# Patient Record
Sex: Female | Born: 1975 | Race: Black or African American | Hispanic: No | Smoking: Current every day smoker
Health system: Southern US, Community
[De-identification: ages and names within clinical notes are randomized; demographics above are authoritative.]

## PROBLEM LIST (undated history)

## (undated) DIAGNOSIS — J984 Other disorders of lung: Secondary | ICD-10-CM

## (undated) HISTORY — PX: LUNG SURGERY: SHX703

---

## 1999-03-24 ENCOUNTER — Inpatient Hospital Stay (HOSPITAL_COMMUNITY): Admission: AD | Admit: 1999-03-24 | Discharge: 1999-03-24 | Payer: Self-pay | Admitting: *Deleted

## 1999-06-16 ENCOUNTER — Inpatient Hospital Stay (HOSPITAL_COMMUNITY): Admission: AD | Admit: 1999-06-16 | Discharge: 1999-06-16 | Payer: Self-pay | Admitting: *Deleted

## 1999-09-16 ENCOUNTER — Inpatient Hospital Stay (HOSPITAL_COMMUNITY): Admission: AD | Admit: 1999-09-16 | Discharge: 1999-09-16 | Payer: Self-pay | Admitting: *Deleted

## 2000-06-29 ENCOUNTER — Emergency Department (HOSPITAL_COMMUNITY): Admission: EM | Admit: 2000-06-29 | Discharge: 2000-06-29 | Payer: Self-pay | Admitting: Emergency Medicine

## 2001-02-03 ENCOUNTER — Inpatient Hospital Stay (HOSPITAL_COMMUNITY): Admission: AD | Admit: 2001-02-03 | Discharge: 2001-02-03 | Payer: Self-pay | Admitting: *Deleted

## 2001-03-03 ENCOUNTER — Inpatient Hospital Stay (HOSPITAL_COMMUNITY): Admission: AD | Admit: 2001-03-03 | Discharge: 2001-03-03 | Payer: Self-pay | Admitting: *Deleted

## 2001-09-19 ENCOUNTER — Encounter (INDEPENDENT_AMBULATORY_CARE_PROVIDER_SITE_OTHER): Payer: Self-pay | Admitting: Specialist

## 2001-09-19 ENCOUNTER — Inpatient Hospital Stay (HOSPITAL_COMMUNITY): Admission: AD | Admit: 2001-09-19 | Discharge: 2001-09-21 | Payer: Self-pay | Admitting: Obstetrics

## 2008-06-30 ENCOUNTER — Emergency Department (HOSPITAL_COMMUNITY): Admission: EM | Admit: 2008-06-30 | Discharge: 2008-06-30 | Payer: Self-pay | Admitting: Emergency Medicine

## 2010-01-03 ENCOUNTER — Emergency Department (HOSPITAL_COMMUNITY): Admission: EM | Admit: 2010-01-03 | Discharge: 2010-01-03 | Payer: Self-pay | Admitting: Family Medicine

## 2010-01-05 ENCOUNTER — Inpatient Hospital Stay (HOSPITAL_COMMUNITY): Admission: EM | Admit: 2010-01-05 | Discharge: 2010-01-19 | Payer: Self-pay | Admitting: Emergency Medicine

## 2010-01-05 ENCOUNTER — Emergency Department (HOSPITAL_COMMUNITY): Admission: EM | Admit: 2010-01-05 | Discharge: 2010-01-05 | Payer: Self-pay | Admitting: Emergency Medicine

## 2010-01-05 ENCOUNTER — Ambulatory Visit: Payer: Self-pay | Admitting: Internal Medicine

## 2010-01-07 ENCOUNTER — Ambulatory Visit: Payer: Self-pay | Admitting: Thoracic Surgery

## 2010-01-08 ENCOUNTER — Encounter (INDEPENDENT_AMBULATORY_CARE_PROVIDER_SITE_OTHER): Payer: Self-pay | Admitting: Internal Medicine

## 2010-01-10 ENCOUNTER — Telehealth (INDEPENDENT_AMBULATORY_CARE_PROVIDER_SITE_OTHER): Payer: Self-pay | Admitting: *Deleted

## 2010-01-10 ENCOUNTER — Encounter: Payer: Self-pay | Admitting: Thoracic Surgery

## 2010-01-11 ENCOUNTER — Ambulatory Visit: Payer: Self-pay | Admitting: Infectious Diseases

## 2010-01-24 ENCOUNTER — Ambulatory Visit: Payer: Self-pay | Admitting: Physician Assistant

## 2010-01-24 DIAGNOSIS — J869 Pyothorax without fistula: Secondary | ICD-10-CM | POA: Insufficient documentation

## 2010-01-24 DIAGNOSIS — D649 Anemia, unspecified: Secondary | ICD-10-CM | POA: Insufficient documentation

## 2010-01-25 LAB — CONVERTED CEMR LAB
BUN: 8 mg/dL (ref 6–23)
Basophils Absolute: 0.2 10*3/uL — ABNORMAL HIGH (ref 0.0–0.1)
Benzodiazepines.: NEGATIVE
Calcium: 9.5 mg/dL (ref 8.4–10.5)
Creatinine, Ser: 0.65 mg/dL (ref 0.40–1.20)
Creatinine,U: 201.4 mg/dL
Eosinophils Relative: 2 % (ref 0–5)
Hemoglobin: 10.4 g/dL — ABNORMAL LOW (ref 12.0–15.0)
Lymphs Abs: 2.1 10*3/uL (ref 0.7–4.0)
Methadone: NEGATIVE
Monocytes Relative: 8 % (ref 3–12)
Platelets: 715 10*3/uL — ABNORMAL HIGH (ref 150–400)
Potassium: 4.5 meq/L (ref 3.5–5.3)
Propoxyphene: NEGATIVE
RBC: 3.7 M/uL — ABNORMAL LOW (ref 3.87–5.11)
WBC: 7.3 10*3/uL (ref 4.0–10.5)

## 2010-01-26 ENCOUNTER — Ambulatory Visit: Payer: Self-pay | Admitting: Thoracic Surgery

## 2010-01-26 ENCOUNTER — Encounter: Admission: RE | Admit: 2010-01-26 | Discharge: 2010-01-26 | Payer: Self-pay | Admitting: Thoracic Surgery

## 2010-02-01 ENCOUNTER — Ambulatory Visit: Payer: Self-pay | Admitting: Infectious Diseases

## 2010-02-01 LAB — CONVERTED CEMR LAB
AST: 20 units/L (ref 0–37)
Albumin: 4.2 g/dL (ref 3.5–5.2)
BUN: 10 mg/dL (ref 6–23)
CO2: 25 meq/L (ref 19–32)
Eosinophils Relative: 9 % — ABNORMAL HIGH (ref 0–5)
Hemoglobin: 11 g/dL — ABNORMAL LOW (ref 12.0–15.0)
MCHC: 31.3 g/dL (ref 30.0–36.0)
MCV: 89.6 fL (ref >=78.0)
Monocytes Absolute: 0.6 10*3/uL (ref 0.1–1.0)
Monocytes Relative: 7 % (ref 3–12)
Neutrophils Relative %: 52 % (ref 43–77)
RBC: 3.93 M/uL (ref 3.87–5.11)
Sed Rate: 58 mm/hr — ABNORMAL HIGH (ref 0–22)
Total Bilirubin: 0.3 mg/dL (ref 0.3–1.2)
Total Protein: 8.1 g/dL (ref 6.0–8.3)
WBC: 7.6 10*3/uL (ref 4.0–10.5)

## 2010-02-16 ENCOUNTER — Encounter: Admission: RE | Admit: 2010-02-16 | Discharge: 2010-02-16 | Payer: Self-pay | Admitting: Thoracic Surgery

## 2010-02-16 ENCOUNTER — Ambulatory Visit: Payer: Self-pay | Admitting: Thoracic Surgery

## 2010-03-03 ENCOUNTER — Ambulatory Visit: Payer: Self-pay | Admitting: Infectious Diseases

## 2010-03-03 DIAGNOSIS — B373 Candidiasis of vulva and vagina: Secondary | ICD-10-CM | POA: Insufficient documentation

## 2010-03-03 LAB — CONVERTED CEMR LAB
Basophils Absolute: 0.1 10*3/uL (ref 0.0–0.1)
Basophils Relative: 1 % (ref 0–1)
CRP: 0.2 mg/dL (ref <0.6)
Hemoglobin: 12.2 g/dL (ref 12.0–15.0)
Lymphocytes Relative: 37 % (ref 12–46)
Lymphs Abs: 2.4 10*3/uL (ref 0.7–4.0)
MCHC: 32.4 g/dL (ref 30.0–36.0)
MCV: 87.3 fL (ref 78.0–100.0)
Platelets: 384 10*3/uL (ref 150–400)
RDW: 13.9 % (ref 11.5–15.5)
Sed Rate: 7 mm/hr (ref 0–22)

## 2010-03-23 ENCOUNTER — Encounter: Payer: Self-pay | Admitting: Infectious Diseases

## 2010-03-23 ENCOUNTER — Encounter: Admission: RE | Admit: 2010-03-23 | Discharge: 2010-03-23 | Payer: Self-pay | Admitting: Thoracic Surgery

## 2010-03-23 ENCOUNTER — Ambulatory Visit: Payer: Self-pay | Admitting: Thoracic Surgery

## 2010-08-24 ENCOUNTER — Ambulatory Visit: Payer: Self-pay | Admitting: Physician Assistant

## 2010-08-24 DIAGNOSIS — J0110 Acute frontal sinusitis, unspecified: Secondary | ICD-10-CM | POA: Insufficient documentation

## 2010-08-24 DIAGNOSIS — R21 Rash and other nonspecific skin eruption: Secondary | ICD-10-CM | POA: Insufficient documentation

## 2010-08-25 ENCOUNTER — Ambulatory Visit: Payer: Self-pay | Admitting: Internal Medicine

## 2010-08-25 ENCOUNTER — Encounter: Payer: Self-pay | Admitting: Physician Assistant

## 2010-08-25 DIAGNOSIS — R197 Diarrhea, unspecified: Secondary | ICD-10-CM

## 2010-08-25 DIAGNOSIS — R05 Cough: Secondary | ICD-10-CM | POA: Insufficient documentation

## 2010-08-26 ENCOUNTER — Telehealth (INDEPENDENT_AMBULATORY_CARE_PROVIDER_SITE_OTHER): Payer: Self-pay | Admitting: *Deleted

## 2010-09-23 ENCOUNTER — Telehealth (INDEPENDENT_AMBULATORY_CARE_PROVIDER_SITE_OTHER): Payer: Self-pay | Admitting: Internal Medicine

## 2010-09-23 ENCOUNTER — Encounter: Payer: Self-pay | Admitting: Physician Assistant

## 2010-12-20 NOTE — Assessment & Plan Note (Signed)
Summary: XFU--PNEUMONIA//YC   Vital Signs:  Patient profile:   35 year old female Height:      62.5 inches Weight:      167 pounds BMI:     30.17 Temp:     98.6 degrees F Pulse rate:   98 / minute Pulse rhythm:   regular Resp:     20 per minute BP sitting:   128 / 75  (left arm) Cuff size:   regular  Vitals Entered By: Vesta Mixer CMA (January 24, 2010 9:41 AM) CC: One time hosp f/u.  D/c on 01/20/10 after about a month of being in the hosp for Pneumonia.  Is in pain still from the "lung surgery" Sounds like chest tubes were in. Pain Assessment Patient in pain? yes     Location: left side Intensity: 8  Does patient need assistance? Ambulation Normal   Primary Care Provider:  Tereso Newcomer, PA-C  CC:  One time hosp f/u.  D/c on 01/20/10 after about a month of being in the hosp for Pneumonia.  Is in pain still from the "lung surgery" Sounds like chest tubes were in.Marland Kitchen  History of Present Illness: New Patient. No prior health care. Just d/c from [facility] with LLL pneumonia with empyema.  She required left VATS with decortication.  She also had ARF with creat over 3 on admxn.  She came back to baseline with creat < 1 at d/c.  She was noted to have blood loss anemia.  She was placed on 4-6 weeks of Augmentin and scheduled to have f/u with ID (Dr. Sampson Goon) 3/15 and CVTS (Dr. Edwyna Shell) 3/9.   Still having a lot of pain around sites where her chest tubes were. No fevers, chills, cough.  No vomiting or diarrhea.  No dyspnea.  No chest pain.  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  Edwyna Shell)  Family History: CAD - PGF (MI; CVA) DM - MGM CA- ? type (grandmother) Family History Hypertension - Dad Kidney disease - Dad  Social History: Occupation: Pension scheme manager (Clinical biochemist; call center) Separated 2 kids (boy and girl) Former Smoker Alcohol use-yes Drug use-no (prior THC use) Occupation:  employed Smoking Status:  never Drug Use:  past (marijuana)  Review of Systems  The patient denies fever, syncope, dyspnea on exertion, prolonged cough, melena, and hematochezia.    Physical Exam  General:  alert, well-developed, and well-nourished.   Head:  normocephalic and atraumatic.   Ears:  R ear normal and L ear normal.   Mouth:  pt unable to tolerate exam; cannot visualize post pharynx Neck:  supple.   Chest Wall:  very tender to light touch over chest tube sites stitches still intact no erythema or discharge  Lungs:  normal breath sounds except decreased BS in left base, no crackles, and no wheezes.   Heart:  normal rate and regular rhythm.   Abdomen:  soft.   Extremities:  no edema Neurologic:  alert & oriented X3 and cranial nerves II-XII intact.   Psych:  normally interactive.     Impression & Recommendations:  Problem # 1:  Preventive  Health Care (ICD-V70.0)  had flu and pneumovax in hosp states Td done in last 10 years - not sure of date needs CPP .  . . schedule in next 2-3 mos  Orders: T-Basic Metabolic Panel [id number]) T-CBC w/Diff [id number]) T-Drug Screen-Urine, (single) [id number]) T-TSH [phone]) T-Syphilis Test (RPR) [phone])  Problem # 2:  ANEMIA (ICD-285.9) blood loss in hosp (hg 9.2 at d/c) repeat cbc now to ensure improving   Orders: T-CBC w/Diff ([id number])  Problem # 3:  EMPYEMA (ICD-510.9) s/p Left VATS still having sig amount of pain o/w seems to be improved f/u with Dr. Edwyna Shell and Dr. Sampson Goon  Complete Medication List: 1)  Augmentin 875-125 Mg Tabs (Amoxicillin-pot clavulanate) .Marland Kitchen.. 1 by  mouth two times a day 2)  Ibuprofen 800 Mg Tabs (Ibuprofen) .Marland Kitchen.. 1 by mouth three times a day as needed 3)  Percocet 5-325 Mg Tabs (Oxycodone-acetaminophen) .Marland Kitchen.. 1-2 by mouth q 3 hours as needed  Patient Instructions: 1)  Take 650 - 1000 mg of tylenol every 4-6 hours as needed for relief of pain or comfort of fever. Avoid taking more than 4000 mg in a 24 hour period( can cause liver damage in higher doses).  2)  You can take ibuprofen if the tylenol does not help. 3)  Then, you can take the Percocet if the ibuprofen does not help. 4)  Schedule CPP with Mickey Hebel in 2-3 months. 5)  Return sooner as needed. 6)  Follow up with Dr. Edwyna Shell and Dr. Sampson Goon as directed. 7)  Contact Dr. Edwyna Shell for refills on pain medicines.

## 2010-12-20 NOTE — Assessment & Plan Note (Signed)
Summary: sinusitis; rash   Vital Signs:  Patient profile:   35 year old female Height:      62.5 inches Weight:      181 pounds BMI:     32.70 Temp:     98.3 degrees F oral Pulse rate:   72 / minute Pulse rhythm:   regular Resp:     18 per minute BP sitting:   140 / 86  (left arm) Cuff size:   regular  Vitals Entered By: Arm[place] Shannon (August 24, 2010 12:19 PM) CC: flu like symptoms.... pt says she has fever, achy body, headaches, no appetite, nausea... very weak pt did take otc meds..  meds review.. pt says she dont take any meds... Is Patient Diabetic? No Pain Assessment Patient in pain? no       Does patient need assistance? Functional Status Self care Ambulation Normal   Primary Care Provider:  Tereso Newcomer, PA-C  CC:  flu like symptoms.... pt says she has fever, achy body, headaches, no appetite, and nausea... very weak pt did take otc meds..  meds review.. pt says she dont take any meds....  History of Present Illness: Feeling bad x 2 days.  Fever 101 degrees.  + Headache -points to bilat frontal sinuses.  Jaws hurt.  +Cough - small amount of phlegm this am (green).  No hemoptysis.  Blood noted in nasal discharge.  Notes significant nasal congestion.  No dyspnea.  Has h/o empyema.  Used to have pain on left chest but resolved when she went back to work in May.  She started noticing again when she got sick 2 days ago.  No otalgia.  + sore throat.  Notes some nausea and diarrhea.  No vomiting.  Feels hungry . . .no appetite.  No rashes.  Feels like she did when she started to get sick at the beginning of the year when she ended up with empyema and required VATS.   Also has rash on chest.  Began after wearing cheap jewelry.  No pruritus.  Problems Prior to Update: 1)  Skin Rash  (ICD-782.1) 2)  Acute Frontal Sinusitis  (ICD-461.1) 3)  Candidiasis of Vulva and Vagina  (ICD-112.1) 4)  Preventive Health Care  (ICD-V70.0) 5)  Anemia  (ICD-285.9) 6)  Empyema   (ICD-510.9)  Allergies (verified): No Known Drug Allergies  Past History:  Past Medical History: Last updated: 01/24/2010 Left Lower Lobe Pneumonia with Empyema 12/2009 req. VATS   a.  assoc ARF likely related to dehydration; corrected prior to d/c from [facility].  Past Surgical History: Last updated: 01/24/2010 s/p  Left VATS, minithoracotomy, and drainage of  empyema with decortication (01/11/2010 - Dr. Edwyna Shell)  Physical Exam  General:  alert, well-developed, and well-nourished.   Head:  normocephalic and atraumatic.   Eyes:  pupils equal, pupils round, and pupils reactive to light.   Ears:  R ear normal and L ear normal.   Nose:  no external deformity.   Mouth:  pharynx pink and moist and no exudates.   Neck:  no cervical lymphadenopathy.   Lungs:  normal breath sounds, no crackles, and no wheezes.   Heart:  normal rate and regular rhythm.   Neurologic:  alert & oriented X3 and cranial nerves II-XII intact.   Skin:  mod to large sized diffuse plaque on sternal area with central clearing Psych:  normally interactive.     Impression & Recommendations:  Problem # 1:  SKIN RASH (ICD-782.1)  contact dermatitis vs tinea  will rx with lotrisone  Her updated medication list for this problem includes:    Lotrisone 1-0.05 % Crea (Clotrimazole-betamethasone) .Marland Kitchen... Apply to area two times a day until clear; do not use more than 2 weeks  Problem # 2:  ACUTE FRONTAL SINUSITIS (ICD-461.1) given her past hx will go ahead and tx with antibxs f/u if no better  The following medications were removed from the medication list:    Augmentin 875-125 Mg Tabs (Amoxicillin-pot clavulanate) .Marland Kitchen... 1 by mouth two times a day Her updated medication list for this problem includes:    Azithromycin 250 Mg Tabs (Azithromycin) .Marland Kitchen... Take 2 tabs by mouth today, then, starting tomorrow, take one by mouth once daily for 4 days  Complete Medication List: 1)  Azithromycin 250 Mg Tabs (Azithromycin) .... Take  2 tabs by mouth today, then, starting tomorrow, take one by mouth once daily for 4 days 2)  Lotrisone 1-0.05 % Crea (Clotrimazole-betamethasone) .... Apply to area two times a day until clear; do not use more than 2 weeks  Patient Instructions: 1)  Apply cream to chest two times a day for no more than 2 weeks. 2)  Schedule follow up if rash no better or getting worse. 3)  Take antibiotics until all gone. 4)  Get plenty of rest.  Drink plenty of fluids.  Take tylenol as needed.  Use nasal saline as needed for nasal congestion. 5)  Return for follow up if no better or feeling worse. Prescriptions: LOTRISONE 1-0.05 % CREA (CLOTRIMAZOLE-BETAMETHASONE) apply to area two times a day until clear; do not use more than 2 weeks  #45 grams x 0   Entered and Authorized by:   Tereso Newcomer PA-C   Signed by:   Tereso Newcomer PA-C on 08/24/2010   Method used:   Print then Give to Patient   RxID:   [id number] AZITHROMYCIN 250 MG TABS (AZITHROMYCIN) Take 2 tabs by mouth today, then, starting tomorrow, take one by mouth once daily for 4 days  #6 x 0   Entered and Authorized by:   Tereso Newcomer PA-C   Signed by:   Tereso Newcomer PA-C on 08/24/2010   Method used:   Print then Give to Patient   RxID:   [id number]

## 2010-12-20 NOTE — Letter (Signed)
Summary: PT INFORMATION SHEET  PT INFORMATION SHEET   Imported By: Arta Bruce 03/23/2010 12:10:50  _____________________________________________________________________  External Attachment:    Type:   Image     Comment:   External Document

## 2010-12-20 NOTE — Letter (Signed)
Summary: [employer]//FAXED REQUESTED RECORDS  [employer]//FAXED REQUESTED RECORDS   Imported By: Arta Bruce 10/12/2010 11:00:54  _____________________________________________________________________  External Attachment:    Type:   Image     Comment:   External Document

## 2010-12-20 NOTE — Assessment & Plan Note (Signed)
Summary: hsfu need chart empyema/kam   Primary Provider:  Tereso Newcomer, PA-C  CC:  new patient follow up from lung infection.  History of Present Illness: 35 yo previously healthy female admitted with PNA 2/16 and found to have empyema. 2/16-3/2 with post pna empyema. VATS by Dr Edwyna Shell 2/22  (Left VATS, minithoracotomy, and drainage of empyema with decortication).  Course complicated by blood loss anemia and arf both resolved.  Treated initially with IV abx but all cxs negative (blood, sputum and empyema fluid).  Urine strep pna and legionella ag neg.  HIV neg, MRSA pcr negative D/c on march 3rd with augmentin - no side effects since then. Breathing improved.  No cough.  Still with chest pain at site of chest tubes and throughout.  No fevers but having some night sweats.    Taking percocet 2 every 3 hours Now taking vicodin.    Preventive Screening-Counseling & Management  Alcohol-Tobacco     Alcohol drinks/day: 1     Smoking Status: never  Caffeine-Diet-Exercise     Caffeine use/day: no   Updated Prior Medication List: AUGMENTIN 875-125 MG TABS (AMOXICILLIN-POT CLAVULANATE) 1 by mouth two times a day HYDROCODONE-ACETAMINOPHEN 7.5-500 MG TABS (HYDROCODONE-ACETAMINOPHEN) take one to two tablets every 4 to 6 hours as needed for pain LORTAB 7.5-500 MG TABS (HYDROCODONE-ACETAMINOPHEN) one to tow by mouth q 4 to 6 hours as needed for pain  Current Allergies (reviewed today): No known allergies  Past History:  Past Medical History: Last updated: 01/24/2010 Left Lower Lobe Pneumonia with Empyema 12/2009 req. VATS   a.  assoc ARF likely related to dehydration; corrected prior to d/c from [facility].  Past Surgical History: Last updated: 01/24/2010 s/p  Left VATS, minithoracotomy, and drainage of  empyema with decortication (01/11/2010 - Dr. Edwyna Shell)  Family History: Last updated: 01/24/2010 CAD - PGF (MI; CVA) DM - MGM CA- ? 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; call center) Separated 2 kids (boy and girl) Former Smoker Alcohol use-yes Drug use-no (prior THC use)  Risk Factors: Alcohol Use: 1 (02/01/2010) Caffeine Use: no (02/01/2010)  Risk Factors: Smoking Status: never (02/01/2010)  Review of Systems       11 systems reviewed and negative except per HPI   Vital Signs:  Patient profile:   35 year old female Height:      62.5 inches (158.75 cm) Weight:      167.2 pounds (76.00 kg) BMI:     30.20 Temp:     97.6 degrees F (36.44 degrees C) oral Pulse rate:   84 / minute BP sitting:   138 / 90  (left arm)  Vitals Entered By: Wendall Mola CMA Duncan Dull) (February 01, 2010 10:27 AM) CC: new patient follow up from lung infection Is Patient Diabetic? No Pain Assessment Patient in pain? yes     Location: left side and abdomen Intensity: 7 Type: stabbing Onset of pain  Constant Nutritional Status BMI of 25 - 29 = overweight Nutritional Status Detail appetite "better"  Have you ever been in a relationship where you felt threatened, hurt or afraid?No   Does patient need assistance? Functional Status Self care Ambulation Normal Comments no missed doses of meds per patient   Physical Exam  General:  alert and well-developed.   Head:  normocephalic and atraumatic.   Eyes:  vision grossly intact, pupils equal, and pupils round.   Ears:  L ear normal.  Mouth:  good dentition.   Neck:  supple.   Lungs:  decreased bs L base ct sites well healed Heart:  normal rate and regular rhythm.   Abdomen:  soft and non-tender.   Msk:  normal ROM and no joint tenderness.   Extremities:  no cce Neurologic:  alert & oriented X3 and cranial nerves II-XII intact.   Additional Exam:  March 9th cxr IMPRESSION:    1.  Left pleural effusions slightly smaller.  Left lung infiltrates   improved.   3.  No new findings.   Impression &  Recommendations:  Problem # 1:  EMPYEMA (ICD-510.9) Assessment Improved 35 yo previously healthy female admitted with PNA 2/16 and found to have empyema. 2/16-3/2 with post pna empyema. VATS by Dr Edwyna Shell 2/22  (Left VATS, minithoracotomy, and drainage of empyema with decortication).  Course complicated by blood loss anemia and arf both resolved.  Treated initially with IV abx but all cxs negative (blood, sputum and empyema fluid).  Urine strep pna and legionella ag neg.  HIV neg, MRSA pcr negative.  AFB and fungal cx from pleura fluid negative    Improving clinically but still with  pain.  Pleural effusions persist on cxr. done by Dr Edwyna Shell.   Cxs including afb and fungal  negative. On augmentin - tolerating it well  ESR and crp were sky high in hospital - currently 58 and 0.3 so markedly improved from 101 and 40.   Contiue augmentin until seen in 3 weeks.   i have given her a refill this one time on vicodin.   Orders: Est. Patient Level IV (16109) T-Comprehensive Metabolic Panel [phone]) T-CBC w/Diff [id number]) T-Sed Rate (Automated) [id number]) T-C-Reactive Protein [phone])  Problem # 2:  ANEMIA (ICD-285.9)  repeat hgb today is stable  Orders: Est. Patient Level IV (24401)  Hgb: 10.4 (01/24/2010)   Hct: 33.1 (01/24/2010)   Platelets: 715 (01/24/2010) RBC: 3.70 (01/24/2010)   RDW: 13.7 (01/24/2010)   WBC: 7.3 (01/24/2010) MCV: 89.5 (01/24/2010)   MCHC: 31.4 (01/24/2010) TSH: 1.153 (01/24/2010)  Medications Added to Medication List This Visit: 1)  Hydrocodone-acetaminophen 7.5-500 Mg Tabs (Hydrocodone-acetaminophen) .... Take one to two tablets every 4 to 6 hours as needed for pain 2)  Lortab 7.5-500 Mg Tabs (Hydrocodone-acetaminophen) .... One to tow by mouth q 4 to 6 hours as needed for pain  Patient Instructions: 1)  Follow up for 3 weeks. 2)  Continue on the augmentin but call if you have new rash or diarrhea. 3)  Keep a fever and symptom diary and bring to  next visit. Prescriptions: LORTAB 7.5-500 MG TABS (HYDROCODONE-ACETAMINOPHEN) one to tow by mouth q 4 to 6 hours as needed for pain  #90 x 0   Entered and Authorized by:   Clydie Braun MD   Signed by:   Clydie Braun MD on 02/01/2010   Method used:   Print then Give to Patient   RxID:   [id number]  Process Orders Check Orders Results:     [facility]: ABN not required for this insurance Tests Sent for requisitioning (February 03, 2010 4:22 PM):     02/01/2010: [facility] -- T-Comprehensive Metabolic Panel [[id number]] (signed)     02/01/2010: [facility] -- T-CBC w/Diff [[id number]] (signed)     02/01/2010: [facility] -- T-Sed Rate (Automated) [[id number]] (signed)     02/01/2010: [facility] -- T-C-Reactive Protein [phone] (signed)

## 2010-12-20 NOTE — Letter (Signed)
Summary: WORK EXCUSE  WORK EXCUSE   Imported By: Arta Bruce 08/25/2010 12:33:23  _____________________________________________________________________  External Attachment:    Type:   Image     Comment:   External Document

## 2010-12-20 NOTE — Progress Notes (Signed)
Summary: Office Visit//DEPRESSION SCREENING  Office Visit//DEPRESSION SCREENING   Imported By: Arta Bruce 03/23/2010 12:08:24  _____________________________________________________________________  External Attachment:    Type:   Image     Comment:   External Document

## 2010-12-20 NOTE — Miscellaneous (Signed)
Summary: HIPPA RESTRICTION  HIPPA RESTRICTION   Imported By: Florinda Marker 02/01/2010 13:55:54  _____________________________________________________________________  External Attachment:    Type:   Image     Comment:   External Document

## 2010-12-20 NOTE — Progress Notes (Signed)
Summary: IMPORTANT/ CONCERNING HER JOB DISABILITY  Phone Note Call from Patient Call back at Home Phone [phone]   Summary of Call: [facility] PT. MS Missildine CAME TO [place] FOR 1 TIME HOSP FU AND [place] HAD TAKEN HER OUT OF WORK, AND HER INSURANCE COMP. [employer] SAID THEY HAD SUBMITTED PAPERS TO BE FILLED OUT, SO SHE COULD ERCEIVE HER PAY OF DISABILITY ON HER JOB, BUT WE NEVER SAW ANY PAPERS.  MS Berzins TALKED WITH WITH THE COMP TODAY AND WAS TOLD BY THEM IF SOMEONE FROM THE OFFICE  WILL CALL THEM AND GIVE THEM HER MEDICAL DIAGNOISIS AND A FEW QUESTIONS, THEY WILL OPEN HER CLAIM UP, SO SHE CAN GET HER CHECK IN THE MAIL. THEY TOLD HER SOMONE NEEDS TO CALL BY  3PM TODAY. PLEASE, BECAUSE SHE HASN'T GOTTEN PAID, AND SHE NEEDS PAY RENT AND GROCERY.  [employer] CLAIM # IS [id number], DR NEEDS TO DIAL [phone]. FAX IS [phone] Initial call taken by: Leodis Rains,  September 23, 2010 12:53 PM  Follow-up for Phone Call        Spoke with E. Alycen Mack -- I called [employer] and gave verbal read from chart notes.  Faxed most recent office notes and copy of work excuse note per their request  to [phone].    Spoke with pt. and advised of above.  Verbalized understanding that our information won't guarantee her getting her disability, but it will provide information to open her claim. Follow-up by: Dutch Quint RN,  September 23, 2010 3:03 PM  Additional Follow-up for Phone Call Additional follow up Details #1::        Spoke with [employer] and confirmed return-to-work date, since they state that they have not received faxed records yet.  Told this will "close" her case.  Dutch Quint RN  September 23, 2010 4:35 PM

## 2010-12-20 NOTE — Assessment & Plan Note (Signed)
Summary: 3 WK F/U/VS   Primary Provider:  Tereso Newcomer, PA-C  CC:  3 week follow up.  History of Present Illness: 35 yo previously healthy female admitted with PNA 2/16 and found to have empyema. 2/16-3/2 with post pna empyema. VATS by Dr Edwyna Shell 2/22  (Left VATS, minithoracotomy, and drainage of empyema with decortication).  Course complicated by blood loss anemia and arf both resolved.  Treated initially with IV abx but all cxs negative (blood, sputum and empyema fluid).  Urine strep pna and legionella ag neg.  HIV neg, MRSA pcr negative D/c on march 3rd with augmentin - no side effects since then. Seen [facility] 15 esr 58 crp 0.3 and kept on augment two times a day.  Tolerating it well.  Saw Dr Edwyna Shell March 30th and had cxr.  Having yeast infections but no other side effects from abx.  Still having alot of pain but no other sxs.     Preventive Screening-Counseling & Management  Alcohol-Tobacco     Alcohol drinks/day: 1     Smoking Status: never  Caffeine-Diet-Exercise     Caffeine use/day: tea     Does Patient Exercise: no  Safety-Violence-Falls     Seat Belt Use: yes   Prior Medication List:  AUGMENTIN 875-125 MG TABS (AMOXICILLIN-POT CLAVULANATE) 1 by mouth two times a day HYDROCODONE-ACETAMINOPHEN 7.5-500 MG TABS (HYDROCODONE-ACETAMINOPHEN) take one to two tablets every 4 to 6 hours as needed for pain LORTAB 7.5-500 MG TABS (HYDROCODONE-ACETAMINOPHEN) one to tow by mouth q 4 to 6 hours as needed for pain   Current Allergies (reviewed today): No known allergies  Past History:  Past Medical History: Last updated: 01/24/2010 Left Lower Lobe Pneumonia with Empyema 12/2009 req. VATS   a.  assoc ARF likely related to dehydration; corrected prior to d/c from [facility].  Past Surgical History: Last updated: 01/24/2010 s/p  Left VATS, minithoracotomy, and drainage of  empyema with decortication (01/11/2010 - Dr. Edwyna Shell)  Family History: Last updated: 01/24/2010 CAD - PGF (MI;  CVA) DM - MGM CA- ? 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; call center) Separated 2 kids (boy and girl) Former Smoker Alcohol use-yes Drug use-no (prior THC use)  Risk Factors: Alcohol Use: 1 (03/03/2010) Caffeine Use: tea (03/03/2010) Exercise: no (03/03/2010)  Risk Factors: Smoking Status: never (03/03/2010)  Review of Systems       11 systems reviewed and negative except per HPI   Vital Signs:  Patient profile:   35 year old female Height:      62.5 inches (158.75 cm) Weight:      172.0 pounds (78.18 kg) BMI:     31.07 Temp:     98.2 degrees F (36.78 degrees C) oral Pulse rate:   66 / minute BP sitting:   134 / 80  (right arm)  Vitals Entered By: Baxter Hire) (March 03, 2010 11:03 AM) CC: 3 week follow up Is Patient Diabetic? No Pain Assessment Patient in pain? yes     Location: surgical site Intensity: 5 Type: numbing Onset of pain  Constant Nutritional Status BMI of > 30 = obese Nutritional Status Detail appetite is good per patient  Does patient need assistance? Functional Status Self care Ambulation Normal   Physical Exam  General:  alert and well-developed.   Head:  normocephalic and no abnormalities observed.   Eyes:  vision grossly intact, pupils equal, and pupils round.   Lungs:  normal respiratory effort, no accessory muscle use, and normal breath sounds.   Heart:  normal rate and regular rhythm.   Abdomen:  soft.   Msk:  normal ROM, no joint tenderness, and no joint swelling.   Skin:  no rashes.   Psych:  Oriented X3.     Impression & Recommendations:  Problem # 1:  EMPYEMA (ICD-510.9)  35 yo previously healthy female admitted with PNA 2/16 and found to have empyema. 2/16-3/2 with post pna empyema. VATS by Dr Edwyna Shell 2/22  (Left VATS, minithoracotomy, and drainage of empyema with decortication).  Course  complicated by blood loss anemia and arf both resolved.  Treated initially with IV abx but all cxs negative (blood, sputum and empyema fluid).  Urine strep pna and legionella ag neg.  HIV neg, MRSA pcr negative.  AFB and fungal cx from pleura fluid negative    Slowing mproving clinically but still with  pain.  Pleural effusions persist on cxr. done by Dr Edwyna Shell.   Cxs including afb and fungal  negative. On augmentin - tolerating it well.  Still with pain and cxr with small effusion and scaring.  I favor another month of abx and then repeat cxr.  WIll check esr and crp today.  Sees dr Edwyna Shell may 4th.   ESR and crp were sky high in hospital  101 and 40.   - Came down March 15th to 58 and 0.3 so markedly improved.  Repeat today  Orders: Est. Patient Level IV ([address]) T-C-Reactive Protein [phone]) T-CBC w/Diff [phone]) T-Sed Rate (Automated) (13086-57846)  Problem # 2:  ANEMIA (ICD-285.9) Assessment: Improved repeat cbc  Orders: Est. Patient Level IV ([address]) T-C-Reactive Protein (28413-24401) T-CBC w/Diff (02725-36644) T-Sed Rate (Automated) (03474-25956)  Problem # 3:  CANDIDIASIS OF VULVA AND VAGINA (ICD-112.1) Start diflucan.   Her updated medication list for this problem includes:    Diflucan 200 Mg Tabs (Fluconazole) ..... One by mouth as needed for yeast infection  Medications Added to Medication List This Visit: 1)  Diflucan 200 Mg Tabs (Fluconazole) .... One by mouth as needed for yeast infection  Patient Instructions: 1)  follow up one month. 2)  Continue augmentin until followup Prescriptions: HYDROCODONE-ACETAMINOPHEN 7.5-500 MG TABS (HYDROCODONE-ACETAMINOPHEN) take one to two tablets every 4 to 6 hours as needed for pain  #50 x 0   Entered and Authorized by:   Clydie Braun MD   Signed by:   Clydie Braun MD on 03/03/2010   Method used:   Print then Give to Patient   RxID:   [id number] AUGMENTIN 875-125 MG TABS (AMOXICILLIN-POT CLAVULANATE) 1 by  mouth two times a day  #60 x 1   Entered and Authorized by:   Clydie Braun MD   Signed by:   Clydie Braun MD on 03/03/2010   Method used:   Print then Give to Patient   RxID:   [id number] DIFLUCAN 200 MG TABS (FLUCONAZOLE) one by mouth as needed for yeast infection  #10 x 0   Entered and Authorized by:   Clydie Braun MD   Signed by:   Clydie Braun MD on 03/03/2010   Method used:   Print then Give to Patient   RxID:   [id number]  Process Orders Check Orders Results:     [facility]: ABN not required for this insurance Tests Sent for requisitioning (March 16, 2010 12:26 PM):     03/03/2010: [facility] -- T-C-Reactive Protein [[id number]] (signed)     03/03/2010: [facility] --  T-CBC w/Diff [[id number]] (signed)     03/03/2010: [facility] -- T-Sed Rate (Automated) [phone] (signed)

## 2010-12-20 NOTE — Consult Note (Signed)
Summary: [facility]  [facility]   Imported By: Florinda Marker 04/26/2010 16:39:35  _____________________________________________________________________  External Attachment:    Type:   Image     Comment:   External Document

## 2010-12-20 NOTE — Progress Notes (Signed)
Summary: ok to verify w/ insurance company  Phone Note Call from Patient Call back at [phone]   Caller: Worthy Flank Call For: Delford Field Summary of Call: [employer] will be calling to see if patient was seen by [facility].  It is ok to tell them she has as [facility] has been seeing her in Hospital. Initial call taken by: Eugene Gavia,  January 10, 2010 4:37 PM  Follow-up for Phone Call        mother just wanted [place] to know it is ok for pw to  speak with [employer] about her daughter Follow-up by: Philipp Deputy CMA,  January 10, 2010 4:52 PM

## 2010-12-20 NOTE — Progress Notes (Signed)
  Phone Note Other Incoming   Request: Send information Summary of Call: Received request for document to be completed. Request forwarded to [employer].

## 2010-12-20 NOTE — Assessment & Plan Note (Signed)
Summary: Sinusitis;  still feels bad  Nurse Visit   Vital Signs:  Patient profile:   35 year old female Temp:     97.8 degrees F oral Pulse rate:   84 / minute Pulse rhythm:   regular Resp:     24 per minute BP sitting:   118 / 84  Vitals Entered By: Dutch Quint RN (August 25, 2010 11:07 AM) CC: possible reaction to antibiotic Is Patient Diabetic? No Pain Assessment Patient in pain? yes     Location: head, mouth, side, stomach Intensity: 6 Type: pressure, sharp in left side of face  Does patient need assistance? Functional Status Self care Ambulation Normal   Primary Care Provider:  Tereso Newcomer, PA-C  CC:  possible reaction to antibiotic.  History of Present Illness: A little scared because she thinks she's having a reaction and she's getting worse, not better.  Thinks antibiotic has made diarrhea 10 times worse.  States her teeth and mouth hurt since yesterday, feels like her throat is straining to talk.   As above, per RN. She was seen yeterday.  Has a complicated hx with empyema earlier in the year.  She is scared about this occuring again.  She has had some chest tightness and cough.  Cannot bring up sputum.  Has a lot of thick post nasal drip.  Feels nauseated.  Now having diarrhea.  Several stools this am like water.  No bloody stools.  She continues to have facial pain esp on the right.  Notes fever.  Did not record.  WOrried about allergic rxn.  Had no thermometer.  Wants to remain out of work.  Works at a call center.  NO new rashes.  No lip or tongue swelling.  No diff swallowing.  No dyspnea.  No wheezing.  Feels some tightness on left chest.  This is where she had her VATS. Marland Kitchen Tereso Newcomer PA-C  August 25, 2010 12:18 PM    Review of Systems General:  See HPI. Resp:  See HPI; Cough is productive, but unable to cough it out.  Taking OTC cough medicine, suppresses cough for a while, but not long.  States her voice is hoarse, straining to talk.Marland Kitchen GI:  Complains  of abdominal pain, diarrhea, and nausea; Stools are liquid, frequently, about twice an hour.Marland Kitchen   Physical Exam  General:  alert, well-developed, and well-nourished.   Head:  normocephalic and atraumatic.   Ears:  R ear normal and L ear normal.   Mouth:  pharynx pink and moist.  + yellow postnasal drip Neck:  no cervical lymphadenopathy.   Lungs:  normal respiratory effort, normal breath sounds, no crackles, and no wheezes.   Heart:  normal rate and regular rhythm.   Neurologic:  alert & oriented X3 and cranial nerves II-XII intact.   Psych:  normally interactive.     Impression & Recommendations:  Problem # 1:  ACUTE FRONTAL SINUSITIS (ICD-461.1) may have degree of bronchitis likely has a lot of anxiety with her prior hx she also has diarrhea which may be SE of zithromax or viral gastroenteritis  will work on symptom control mucinex dm two times a day . . .get over the counter  nasacort for nasal congestion ventolin sample given to help with chest congestion push fluids ibuprofen for pain and inflammation  Her updated medication list for this problem includes:    Azithromycin 250 Mg Tabs (Azithromycin) .Marland Kitchen... Take 2 tabs by mouth today, then, starting tomorrow, take one by  mouth once daily for 4 days    Nasacort Aq 55 Mcg/act Aers (Triamcinolone acetonide) .Marland Kitchen... 2 sprays each nostril once daily    Mucinex Dm 30-600 Mg Xr12h-tab (Dextromethorphan-guaifenesin) .Marland Kitchen... Take 1 tablet by mouth two times a day for 7 days  Problem # 2:  COUGH (ICD-786.2)  as above check cxr with her hx  Orders: CXR- 2view (CXR)  Problem # 3:  DIARRHEA (ICD-787.91) as above clear liquids . . . advance to BRAT diet  Complete Medication List: 1)  Azithromycin 250 Mg Tabs (Azithromycin) .... Take 2 tabs by mouth today, then, starting tomorrow, take one by mouth once daily for 4 days 2)  Lotrisone 1-0.05 % Crea (Clotrimazole-betamethasone) .... Apply to area two times a day until clear; do not use  more than 2 weeks 3)  Ventolin Hfa 108 (90 Base) Mcg/act Aers (Albuterol sulfate) .Marland Kitchen.. 1-2 puffs every 4-6 hours as needed 4)  Nasacort Aq 55 Mcg/act Aers (Triamcinolone acetonide) .... 2 sprays each nostril once daily 5)  Ibuprofen 200 Mg Tabs (Ibuprofen) .... 2 to 3 tabs every 6-8 hours as needed 6)  Mucinex Dm 30-600 Mg Xr12h-tab (Dextromethorphan-guaifenesin) .... Take 1 tablet by mouth two times a day for 7 days   Patient Instructions: 1)  Maintain clear liquid diet for 24 hours.  You should drink plenty of water, gatorade, etc.  If you can see the bottom of the glass, you can drink the liquid.  Chicken broth is ok, just no noodles or meat. 2)  If you are doing better with your stomach after one day, advance to a bland diet:  BRAT - 3)  B - bananas 4)  R - rice 5)  A - apples 6)  T - toast 7)  Do this for 1-2 days, and advance to a more normal diet . . . SLOWLY . . . as tolerated. 8)  Use the Ventolin 1-2 puffs every 6 hours for 2 days, then as needed.  THe sample I gave you has 60 inhalations. 9)  Get Mucinex DM over the counter and take two times a day for a week.   10)  I have sent nasacort to the [facility] pharmacy.  Use this once daily for 2-3 weeks for nasal congestion. 11)  Take Ibuprofen 400-600 mg every 6-8 hours with food for 2-3 days, then as needed for pain or fever. 12)  Get the xray done. 13)  Get rest. 14)  Schedule follow up if no better in one week or sooner if worse.   Allergies: No Known Drug Allergies  Orders Added: 1)  CXR- 2view [CXR] 2)  Est. Patient Level III [91478] Prescriptions: NASACORT AQ 55 MCG/ACT AERS (TRIAMCINOLONE ACETONIDE) 2 sprays each nostril once daily  #1 x 0   Entered and Authorized by:   Tereso Newcomer PA-C   Signed by:   Tereso Newcomer PA-C on 08/25/2010   Method used:   Faxed to ...       [facility] (retail)       [address] [place], [place]  [address]       Ph: [phone]       Fax:  [phone]   RxID:   [id number] VENTOLIN HFA 108 (90 BASE) MCG/ACT AERS (ALBUTEROL SULFATE) 1-2 puffs every 4-6 hours as needed  #1 x 0   Entered and Authorized by:   Tereso Newcomer PA-C   Signed by:   Tereso Newcomer  PA-C on 08/25/2010   Method used:   Samples Given   RxID:   [id number]

## 2011-02-09 LAB — BASIC METABOLIC PANEL
BUN: 2 mg/dL — ABNORMAL LOW (ref 6–23)
BUN: 25 mg/dL — ABNORMAL HIGH (ref 6–23)
BUN: 29 mg/dL — ABNORMAL HIGH (ref 6–23)
BUN: 4 mg/dL — ABNORMAL LOW (ref 6–23)
BUN: 9 mg/dL (ref 6–23)
CO2: 22 mEq/L (ref 19–32)
CO2: 26 mEq/L (ref 19–32)
CO2: 27 mEq/L (ref 19–32)
CO2: 27 mEq/L (ref 19–32)
CO2: 28 mEq/L (ref 19–32)
Calcium: 8 mg/dL — ABNORMAL LOW (ref 8.4–10.5)
Calcium: 8.1 mg/dL — ABNORMAL LOW (ref 8.4–10.5)
Calcium: 8.1 mg/dL — ABNORMAL LOW (ref 8.4–10.5)
Calcium: 8.2 mg/dL — ABNORMAL LOW (ref 8.4–10.5)
Calcium: 8.3 mg/dL — ABNORMAL LOW (ref 8.4–10.5)
Calcium: 8.4 mg/dL (ref 8.4–10.5)
Chloride: 100 mEq/L (ref 96–112)
Chloride: 101 mEq/L (ref 96–112)
Chloride: 102 mEq/L (ref 96–112)
Chloride: 103 mEq/L (ref 96–112)
Chloride: 107 mEq/L (ref 96–112)
Chloride: 109 mEq/L (ref 96–112)
Chloride: 98 mEq/L (ref 96–112)
Creatinine, Ser: 0.7 mg/dL (ref 0.4–1.2)
Creatinine, Ser: 0.72 mg/dL (ref 0.4–1.2)
Creatinine, Ser: 0.75 mg/dL (ref 0.4–1.2)
Creatinine, Ser: 0.76 mg/dL (ref 0.4–1.2)
Creatinine, Ser: 1.4 mg/dL — ABNORMAL HIGH (ref 0.4–1.2)
Creatinine, Ser: 3.55 mg/dL — ABNORMAL HIGH (ref 0.4–1.2)
Creatinine, Ser: 3.93 mg/dL — ABNORMAL HIGH (ref 0.4–1.2)
GFR calc Af Amer: 16 mL/min — ABNORMAL LOW (ref >60)
GFR calc Af Amer: 53 mL/min — ABNORMAL LOW (ref >60)
GFR calc Af Amer: >60 mL/min (ref >60)
GFR calc Af Amer: >60 mL/min (ref >60)
GFR calc Af Amer: >60 mL/min (ref >60)
GFR calc Af Amer: >60 mL/min (ref >60)
GFR calc Af Amer: >60 mL/min (ref >60)
GFR calc Af Amer: >60 mL/min (ref >60)
GFR calc non Af Amer: 13 mL/min — ABNORMAL LOW (ref >60)
GFR calc non Af Amer: >60 mL/min (ref >60)
GFR calc non Af Amer: >60 mL/min (ref >60)
GFR calc non Af Amer: >60 mL/min (ref >60)
GFR calc non Af Amer: >60 mL/min (ref >60)
Glucose, Bld: 92 mg/dL (ref 70–99)
Glucose, Bld: 97 mg/dL (ref 70–99)
Potassium: 3.9 mEq/L (ref 3.5–5.1)
Potassium: 3.9 mEq/L (ref 3.5–5.1)
Potassium: 4 mEq/L (ref 3.5–5.1)
Potassium: 4 mEq/L (ref 3.5–5.1)
Sodium: 134 mEq/L — ABNORMAL LOW (ref 135–145)
Sodium: 136 mEq/L (ref 135–145)
Sodium: 136 mEq/L (ref 135–145)
Sodium: 137 mEq/L (ref 135–145)

## 2011-02-09 LAB — CBC
HCT: 27.2 % — ABNORMAL LOW (ref 36.0–46.0)
HCT: 28.1 % — ABNORMAL LOW (ref 36.0–46.0)
HCT: 28.9 % — ABNORMAL LOW (ref 36.0–46.0)
HCT: 29.6 % — ABNORMAL LOW (ref 36.0–46.0)
HCT: 36.6 % (ref 36.0–46.0)
Hemoglobin: 11 g/dL — ABNORMAL LOW (ref 12.0–15.0)
Hemoglobin: 12.6 g/dL (ref 12.0–15.0)
Hemoglobin: 9.2 g/dL — ABNORMAL LOW (ref 12.0–15.0)
Hemoglobin: 9.2 g/dL — ABNORMAL LOW (ref 12.0–15.0)
Hemoglobin: 9.4 g/dL — ABNORMAL LOW (ref 12.0–15.0)
Hemoglobin: 9.7 g/dL — ABNORMAL LOW (ref 12.0–15.0)
Hemoglobin: 9.9 g/dL — ABNORMAL LOW (ref 12.0–15.0)
MCHC: 34.2 g/dL (ref 30.0–36.0)
MCHC: 34.4 g/dL (ref 30.0–36.0)
MCHC: 34.6 g/dL (ref 30.0–36.0)
MCHC: 34.7 g/dL (ref 30.0–36.0)
MCHC: 34.8 g/dL (ref 30.0–36.0)
MCHC: 34.9 g/dL (ref 30.0–36.0)
MCV: 89.1 fL (ref 78.0–100.0)
MCV: 89.2 fL (ref 78.0–100.0)
MCV: 89.3 fL (ref 78.0–100.0)
MCV: 89.7 fL (ref 78.0–100.0)
MCV: 90.5 fL (ref 78.0–100.0)
MCV: 90.8 fL (ref 78.0–100.0)
MCV: 90.8 fL (ref 78.0–100.0)
Platelets: 201 10*3/uL (ref 150–400)
Platelets: 210 10*3/uL (ref 150–400)
Platelets: 310 10*3/uL (ref 150–400)
Platelets: 338 10*3/uL (ref 150–400)
Platelets: 440 10*3/uL — ABNORMAL HIGH (ref 150–400)
Platelets: 484 10*3/uL — ABNORMAL HIGH (ref 150–400)
RBC: 2.96 MIL/uL — ABNORMAL LOW (ref 3.87–5.11)
RBC: 3.06 MIL/uL — ABNORMAL LOW (ref 3.87–5.11)
RBC: 3.1 MIL/uL — ABNORMAL LOW (ref 3.87–5.11)
RBC: 3.18 MIL/uL — ABNORMAL LOW (ref 3.87–5.11)
RBC: 3.52 MIL/uL — ABNORMAL LOW (ref 3.87–5.11)
RBC: 3.74 MIL/uL — ABNORMAL LOW (ref 3.87–5.11)
RBC: 4.08 MIL/uL (ref 3.87–5.11)
RBC: 4.57 MIL/uL (ref 3.87–5.11)
RDW: 12.6 % (ref 11.5–15.5)
RDW: 12.9 % (ref 11.5–15.5)
RDW: 13.1 % (ref 11.5–15.5)
RDW: 13.2 % (ref 11.5–15.5)
WBC: 14.8 10*3/uL — ABNORMAL HIGH (ref 4.0–10.5)
WBC: 19.8 10*3/uL — ABNORMAL HIGH (ref 4.0–10.5)
WBC: 20 10*3/uL — ABNORMAL HIGH (ref 4.0–10.5)
WBC: 26 10*3/uL — ABNORMAL HIGH (ref 4.0–10.5)
WBC: 26.1 10*3/uL — ABNORMAL HIGH (ref 4.0–10.5)
WBC: 27.3 10*3/uL — ABNORMAL HIGH (ref 4.0–10.5)
WBC: 33.1 10*3/uL — ABNORMAL HIGH (ref 4.0–10.5)

## 2011-02-09 LAB — AFB CULTURE WITH SMEAR (NOT AT ARMC): Acid Fast Smear: NONE SEEN

## 2011-02-09 LAB — COMPREHENSIVE METABOLIC PANEL
ALT: 12 U/L (ref 0–35)
Alkaline Phosphatase: 80 U/L (ref 39–117)
BUN: 3 mg/dL — ABNORMAL LOW (ref 6–23)
Chloride: 101 mEq/L (ref 96–112)
Glucose, Bld: 97 mg/dL (ref 70–99)
Potassium: 3.9 mEq/L (ref 3.5–5.1)
Total Bilirubin: 0.3 mg/dL (ref 0.3–1.2)

## 2011-02-09 LAB — HEPATIC FUNCTION PANEL
ALT: 10 U/L (ref 0–35)
AST: 19 U/L (ref 0–37)
Bilirubin, Direct: 0.1 mg/dL (ref 0.0–0.3)
Indirect Bilirubin: 0.2 mg/dL — ABNORMAL LOW (ref 0.3–0.9)
Total Bilirubin: 0.3 mg/dL (ref 0.3–1.2)

## 2011-02-09 LAB — URINALYSIS, ROUTINE W REFLEX MICROSCOPIC
Bilirubin Urine: NEGATIVE
Glucose, UA: NEGATIVE mg/dL
Hgb urine dipstick: NEGATIVE
Nitrite: NEGATIVE
Specific Gravity, Urine: 1.013 (ref 1.005–1.030)
pH: 5.5 (ref 5.0–8.0)

## 2011-02-09 LAB — MRSA PCR SCREENING
MRSA by PCR: NEGATIVE
MRSA by PCR: NEGATIVE
MRSA by PCR: NEGATIVE

## 2011-02-09 LAB — CULTURE, BLOOD (ROUTINE X 2)
Culture: NO GROWTH
Culture: NO GROWTH

## 2011-02-09 LAB — POCT I-STAT 3, ART BLOOD GAS (G3+)
Bicarbonate: 27.1 mEq/L — ABNORMAL HIGH (ref 20.0–24.0)
pCO2 arterial: 38.7 mmHg (ref 35.0–45.0)
pH, Arterial: 7.454 — ABNORMAL HIGH (ref 7.350–7.400)
pO2, Arterial: 72 mmHg — ABNORMAL LOW (ref 80.0–100.0)

## 2011-02-09 LAB — CROSSMATCH

## 2011-02-09 LAB — C-REACTIVE PROTEIN: CRP: 40.2 mg/dL — ABNORMAL HIGH (ref <0.6)

## 2011-02-09 LAB — LEGIONELLA ANTIGEN, URINE

## 2011-02-09 LAB — EXPECTORATED SPUTUM ASSESSMENT W GRAM STAIN, RFLX TO RESP C

## 2011-02-09 LAB — DIFFERENTIAL
Basophils Absolute: 0 10*3/uL (ref 0.0–0.1)
Eosinophils Relative: 0 % (ref 0–5)
Lymphocytes Relative: 3 % — ABNORMAL LOW (ref 12–46)
Lymphs Abs: 1 10*3/uL (ref 0.7–4.0)
Monocytes Relative: 5 % (ref 3–12)
Neutrophils Relative %: 92 % — ABNORMAL HIGH (ref 43–77)
WBC Morphology: INCREASED

## 2011-02-09 LAB — HIV ANTIBODY (ROUTINE TESTING W REFLEX): HIV: NONREACTIVE

## 2011-02-09 LAB — URINE MICROSCOPIC-ADD ON

## 2011-02-09 LAB — TISSUE CULTURE

## 2011-02-09 LAB — BODY FLUID CELL COUNT WITH DIFFERENTIAL
Eos, Fluid: 0 %
Monocyte-Macrophage-Serous Fluid: 8 % — ABNORMAL LOW (ref 50–90)
Neutrophil Count, Fluid: 89 % — ABNORMAL HIGH (ref 0–25)
Total Nucleated Cell Count, Fluid: 858 cu mm (ref 0–1000)

## 2011-02-09 LAB — FUNGUS CULTURE W SMEAR: Fungal Smear: NONE SEEN

## 2011-02-09 LAB — MAGNESIUM: Magnesium: 1.6 mg/dL (ref 1.5–2.5)

## 2011-02-09 LAB — CARDIAC PANEL(CRET KIN+CKTOT+MB+TROPI)
CK, MB: 1.1 ng/mL (ref 0.3–4.0)
Relative Index: INVALID (ref 0.0–2.5)
Total CK: 21 U/L (ref 7–177)

## 2011-02-09 LAB — BODY FLUID CULTURE

## 2011-02-09 LAB — CREATININE, SERUM: Creatinine, Ser: 3.58 mg/dL — ABNORMAL HIGH (ref 0.4–1.2)

## 2011-02-09 LAB — SEDIMENTATION RATE: Sed Rate: 101 mm/hr — ABNORMAL HIGH (ref 0–22)

## 2011-02-13 LAB — CBC
MCHC: 34.5 g/dL (ref 30.0–36.0)
MCV: 89.7 fL (ref 78.0–100.0)
RBC: 2.99 MIL/uL — ABNORMAL LOW (ref 3.87–5.11)
RDW: 13.2 % (ref 11.5–15.5)

## 2011-04-04 NOTE — Assessment & Plan Note (Signed)
OFFICE VISIT   Brianna, Padilla  DOB:  01-30-1976                                        January 26, 2010  CHART #:  [id number]   The patient came to the office today and is doing well.  Her chest x-ray  still showed some reaction in the left costophrenic angle but this is  improving.  She is feeling better.  Her incisions were healing well.  We  removed her chest tube stitches.  Her lungs are clear to auscultation  and percussion.  Heart regular sinus rhythm.  No murmur.  I told her to  gradually increase her activities.  She can start driving next week.  We  will see her back again in 3 weeks with another chest x-ray.   Ines Bloomer, M.D.  Electronically Signed   DPB/MEDQ  D:  01/26/2010  T:  01/27/2010  Job:  [id number]

## 2011-04-04 NOTE — Letter (Signed)
Mar 23, 2010   Stann Mainland. Sampson Goon, MD  [address]  [place], [place]  [address]   Re:  Brianna Padilla, TOUTANT                DOB:  [birth year]   Dear Dr. Sampson Goon:   I saw the patient back today.  Her chest x-ray looks good and her  incisions are well healed.  She is doing well overall, but she is still  on some antibiotics, this is causing her to have some diarrhea.  Augmentin is causing her to have diarrhea and I told her she could  probably stop the antibiotic.  I told her she can return to work on Apr 11, 2010, half-time for 2 weeks and then full-time.  She is still having  some mild postthoracotomy pain, but hopefully this will improve.  I feel  she needs a gradual return to work even though she has a job at [employer].  Her pain will limit her ability to work and could be aggravated by  working too long at the start of her return to work. She had a very  severe infection and it is amazing she is doing as well as she is doing.  I do not want to jeopardize her recovery by having her to return to full  time too soon. I will see her back again if she continues to have  problems.   Ines Bloomer, M.D.  Electronically Signed   DPB/MEDQ  D:  03/23/2010  T:  03/24/2010  Job:  [id number]

## 2011-04-04 NOTE — Assessment & Plan Note (Signed)
OFFICE VISIT   Brianna Padilla, Brianna Padilla  DOB:  1976/07/01                                        February 16, 2010  CHART #:  [id number]   The patient came today.  Her blood pressure is 124/80, pulse 72,  respirations 18.  Her lungs are clear to auscultation and percussion.  Chest x-ray shows some bend in the left costophrenic angle, but overall  she is doing well after her decortication.  She is still having some  moderate amount of pain but this is gradually improving.  I will see her  back again in 6 weeks with another chest x-ray.   Ines Bloomer, M.D.  Electronically Signed   DPB/MEDQ  D:  02/16/2010  T:  02/17/2010  Job:  [id number]

## 2011-05-08 IMAGING — CR DG CHEST 1V PORT
1 series · 1 of 1 positions shown · non-contrast
Comparison: Chest radiograph 01/07/2010.

CLINICAL DATA: Pneumonia, congestive heart failure

PORTABLE CHEST - 1 VIEW

[AP]
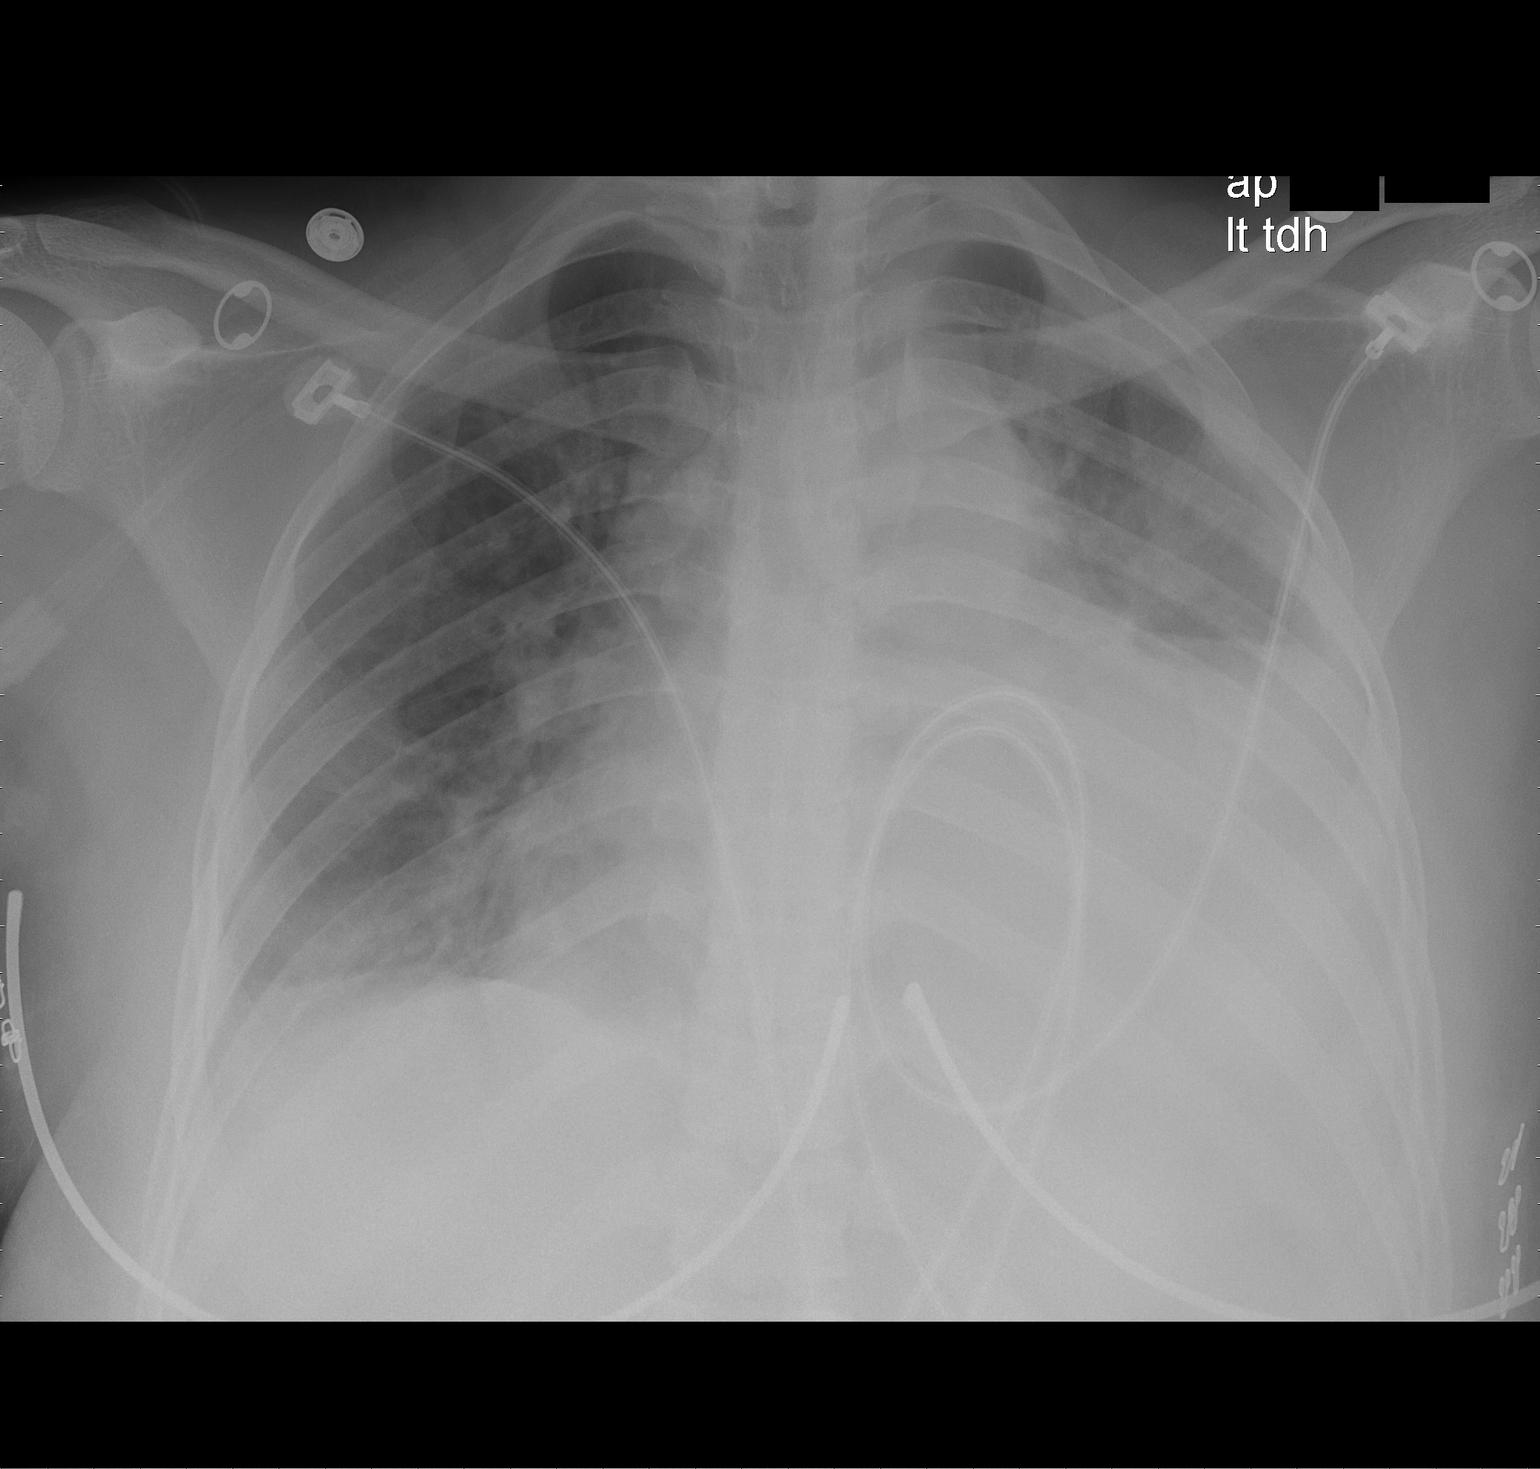

[1 of 1 positions shown; findings below may reference images not displayed]

FINDINGS: Stable enlarged cardiac silhouette.  There is improved
aeration to the left upper lobe compared to prior.  There is dense
consolidation and fluid in the left lower lobe.  There is central
venous pulmonary congestion the right lung.  No evidence of
pneumothorax.
IMPRESSION: 1.  Improved aeration the left upper lobe.
2.  Dense consolidation and pleural fluid in the left lower
hemithorax.

## 2011-05-10 IMAGING — CR DG CHEST 1V PORT
1 series · 1 of 1 positions shown · non-contrast
Comparison: Q. [id number]

CLINICAL DATA: Pneumonia, renal failure.

PORTABLE CHEST - 1 VIEW

[view not recorded]
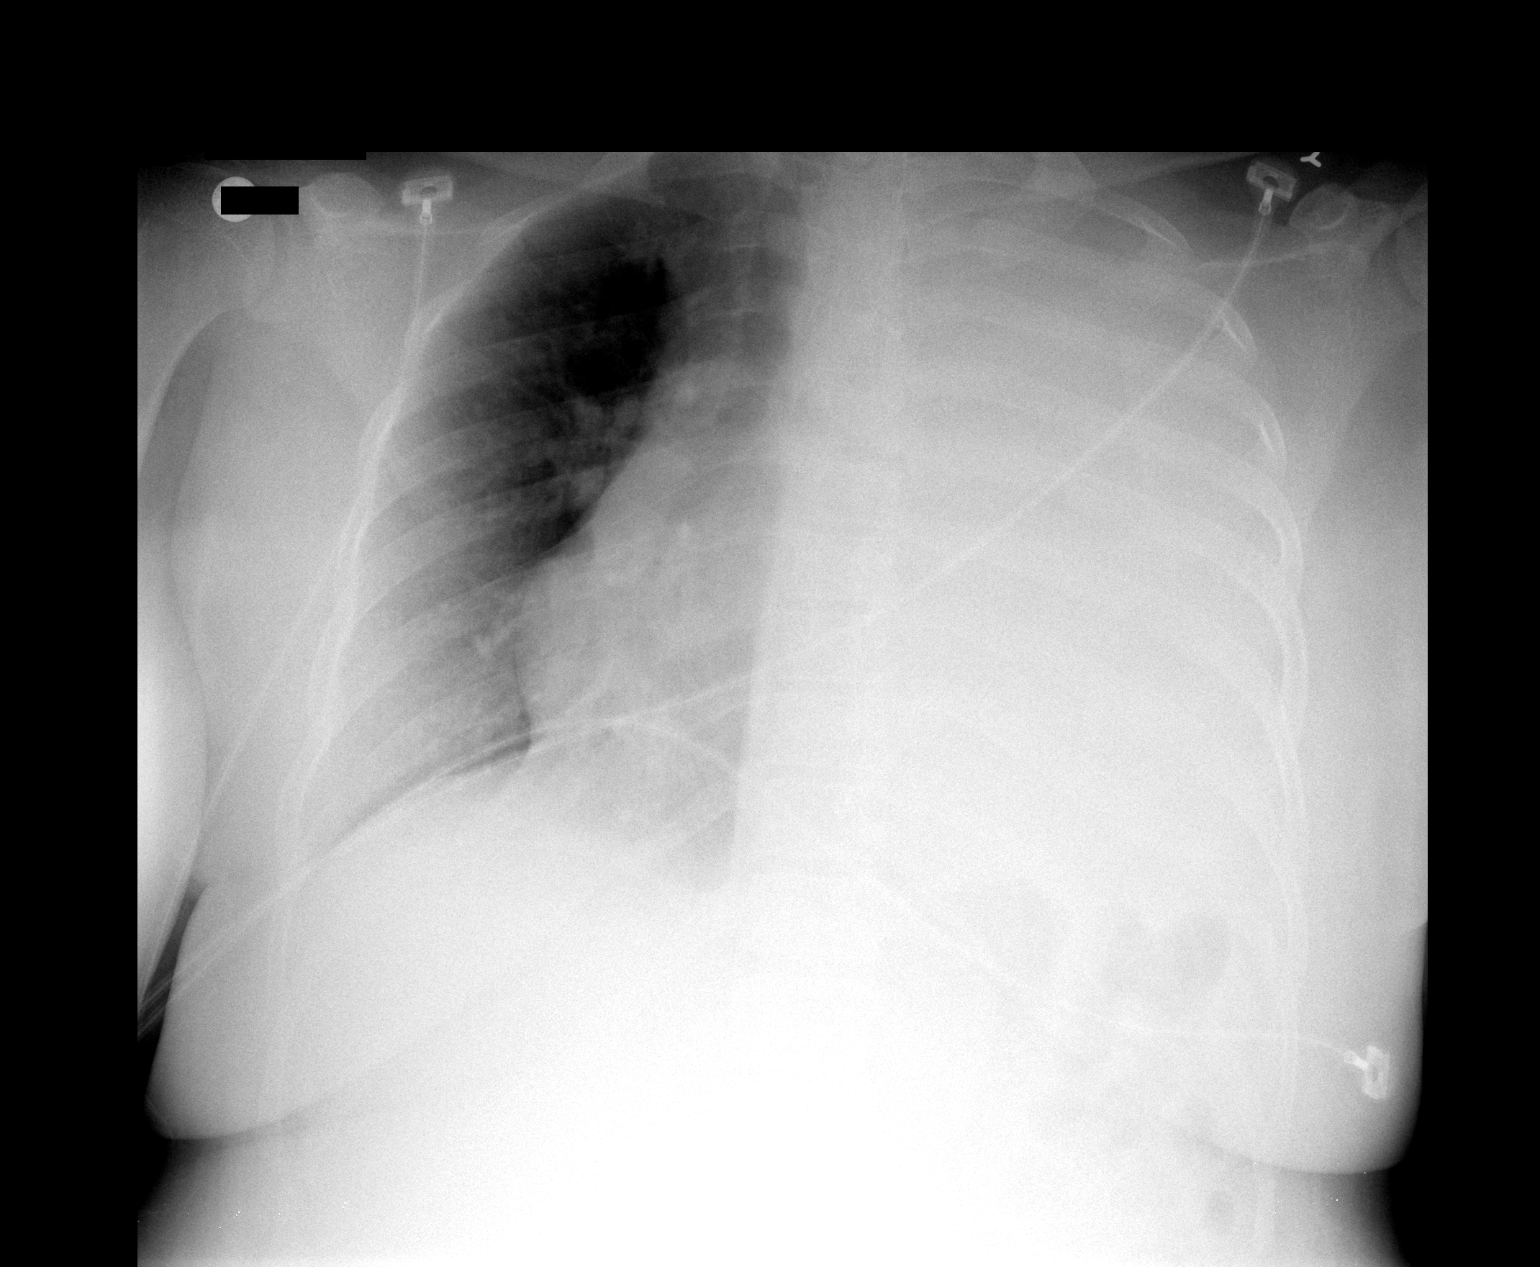

[1 of 1 positions shown; findings below may reference images not displayed]

FINDINGS: Complete opacification of the left hemithorax again
noted.  No focal opacity on the right.  Suspect cardiomegaly.
IMPRESSION: No significant change.

## 2011-05-10 IMAGING — CR DG CHEST 1V PORT
1 series · 1 of 1 positions shown · non-contrast
Comparison: [DATE]

CLINICAL DATA: Follow-up video assisted thoracoscopy

PORTABLE CHEST - 1 VIEW

[view not recorded]
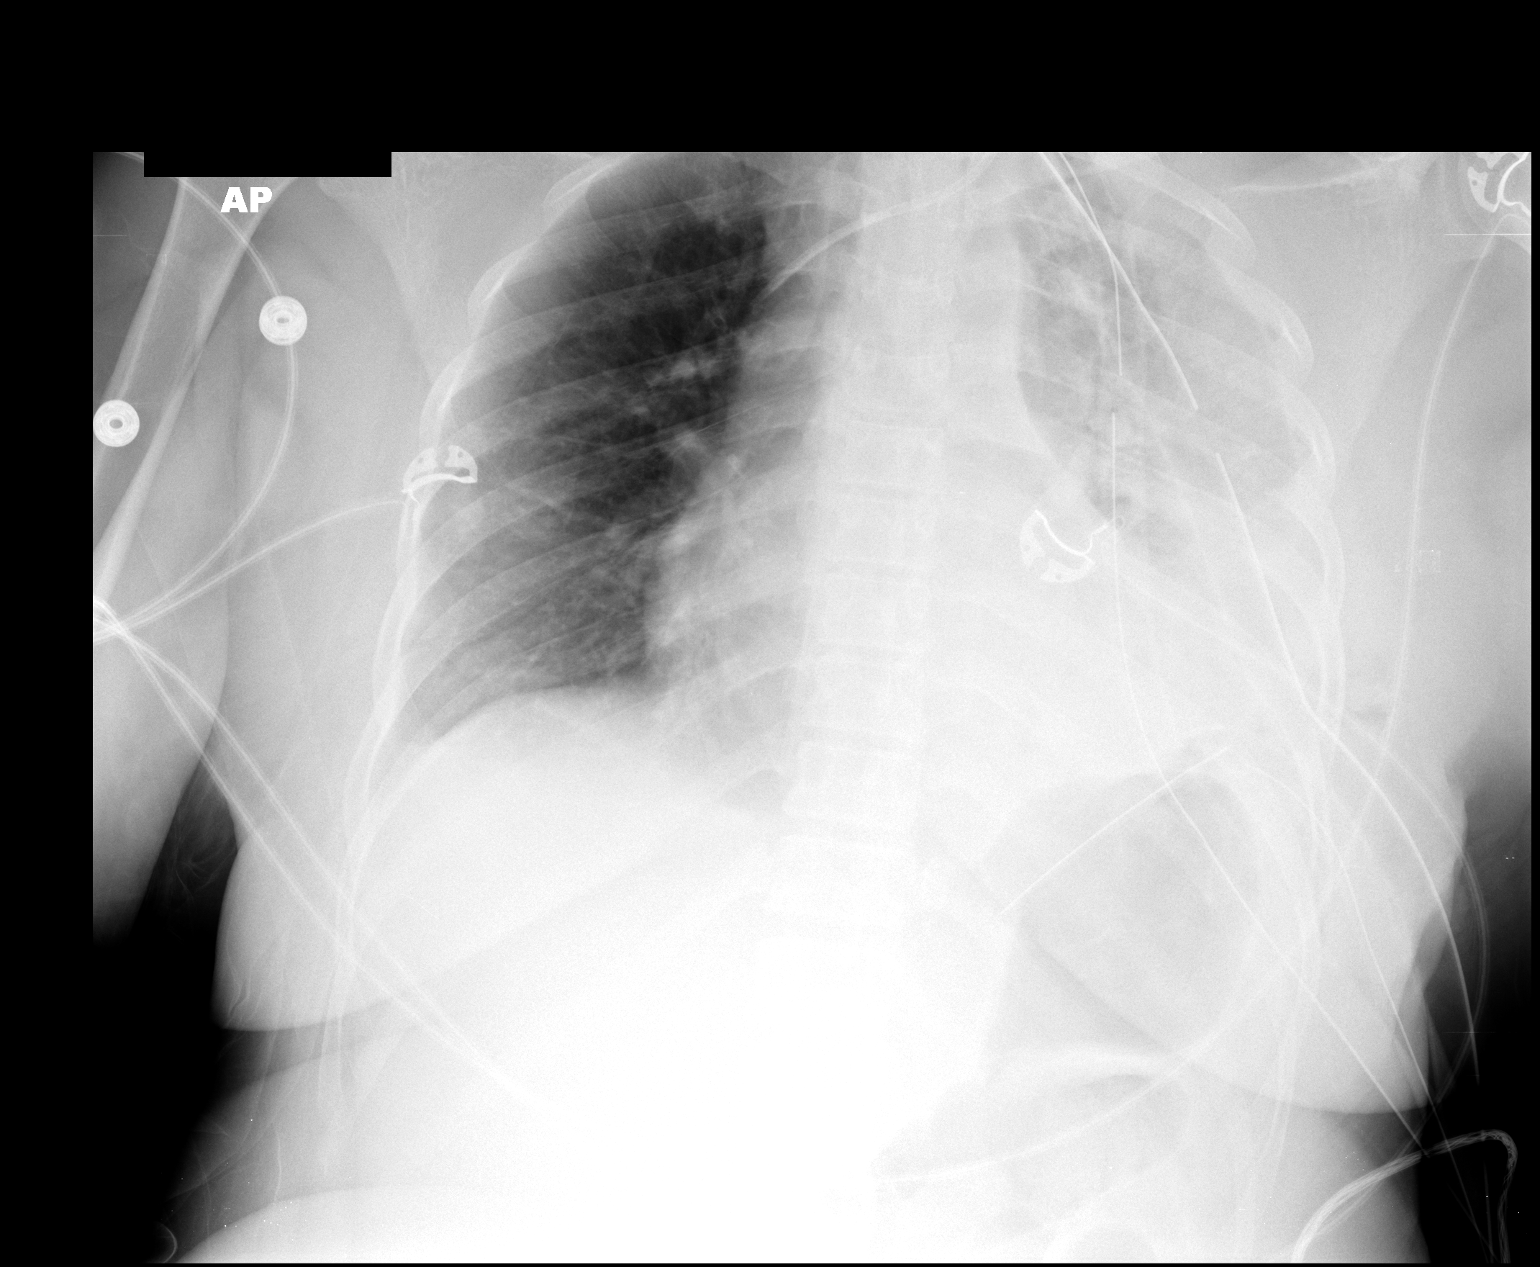

[1 of 1 positions shown; findings below may reference images not displayed]

FINDINGS: Left internal jugular central line has its tip in the SVC
at the azygos level.  There are three chest tubes in place on the
left.  There is less pleural density in there is better aeration of
the left lung.  Right chest remains clear.  No pleural air.
IMPRESSION: Three left chest tube is in place.  Improved, with less pleural
density and better aeration of the left lung.

## 2011-05-11 IMAGING — CR DG CHEST 1V PORT
1 series · 1 of 1 positions shown · non-contrast
Comparison: 01/10/2010

CLINICAL DATA: Pneumonia, acute respiratory failure, left chest
tube

PORTABLE CHEST - 1 VIEW

[view not recorded]
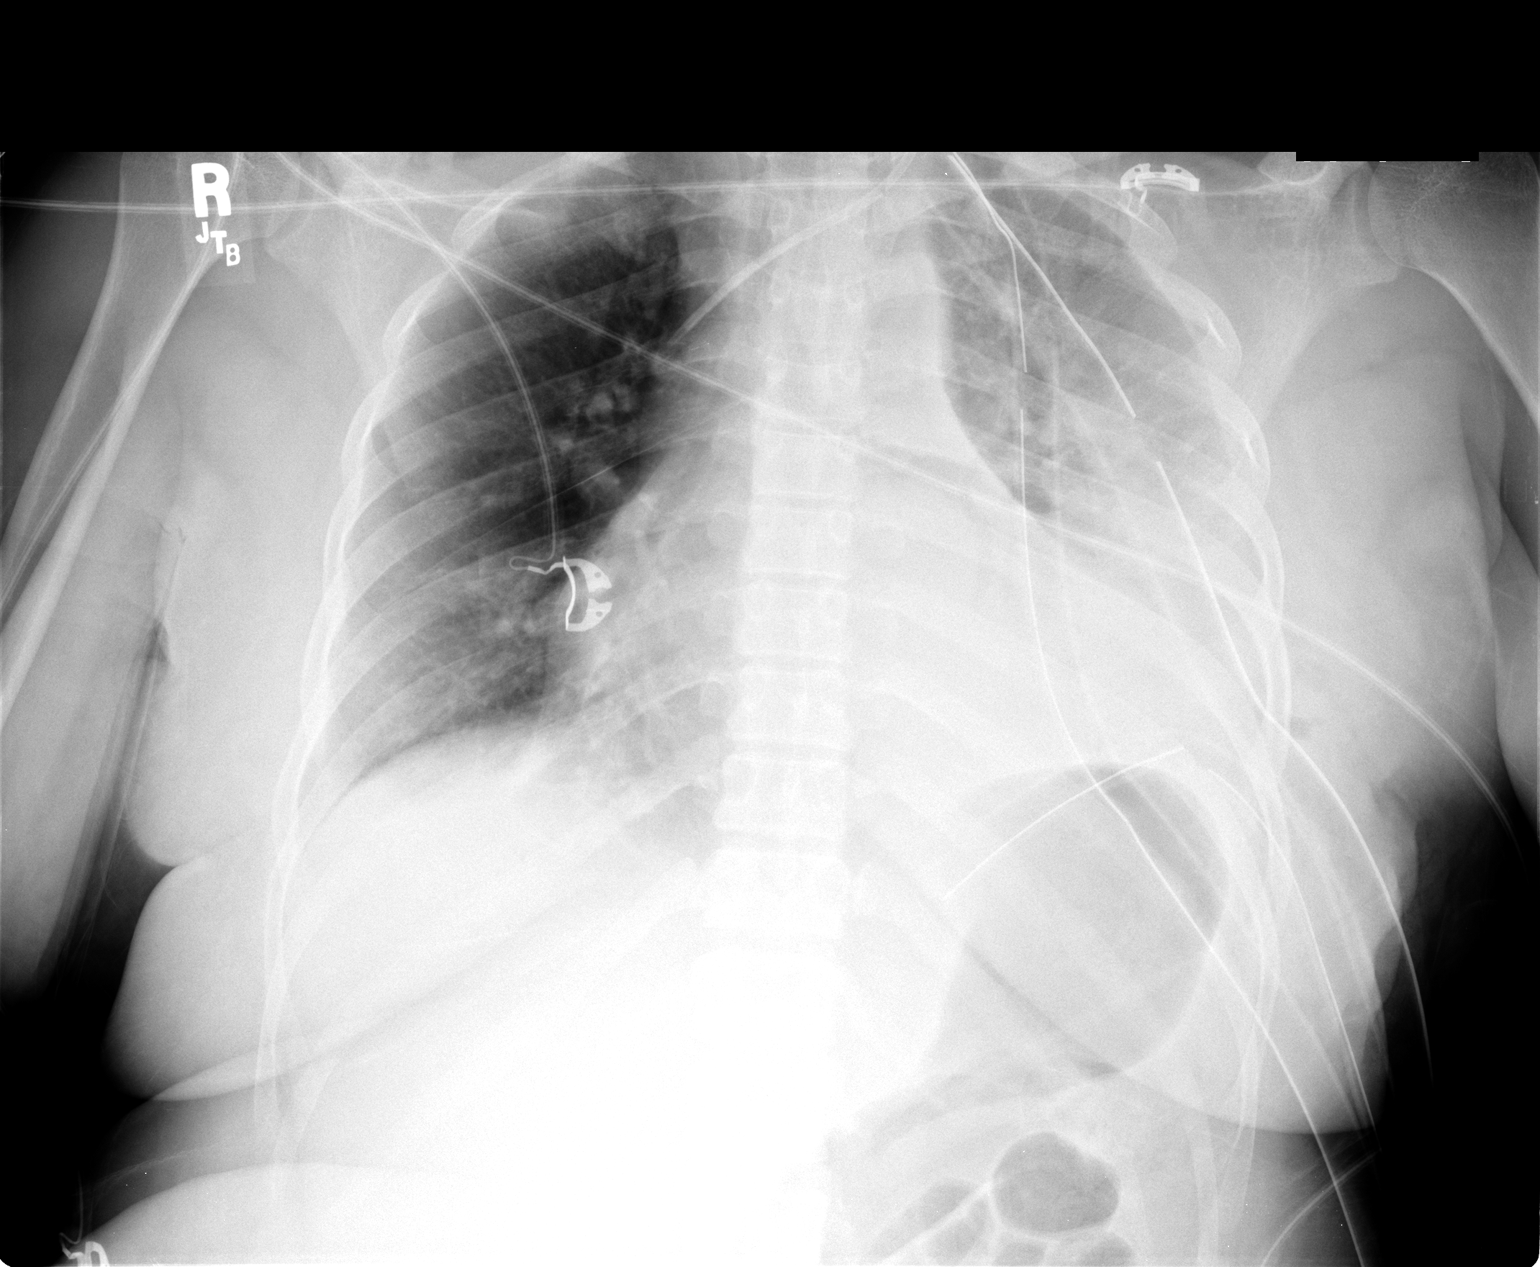

[1 of 1 positions shown; findings below may reference images not displayed]

FINDINGS: Three left chest tubes remain in stable position.  Left
IJ central line tip is at the innominate SVC junction as before.
Residual left effusion persist with left lower lobe dense
consolidation/collapse.  Stable right lung aeration with minimal
basilar atelectasis.  No pneumothorax.  Chest exam is overall
unchanged.
IMPRESSION: Stable portable chest exam.

## 2011-05-12 IMAGING — CR DG CHEST 1V PORT
1 series · 1 of 1 positions shown · non-contrast
Comparison: 01/11/2009 and earlier.

CLINICAL DATA: Respiratory failure/post thoracoscopy

PORTABLE CHEST - 1 VIEW

[view not recorded]
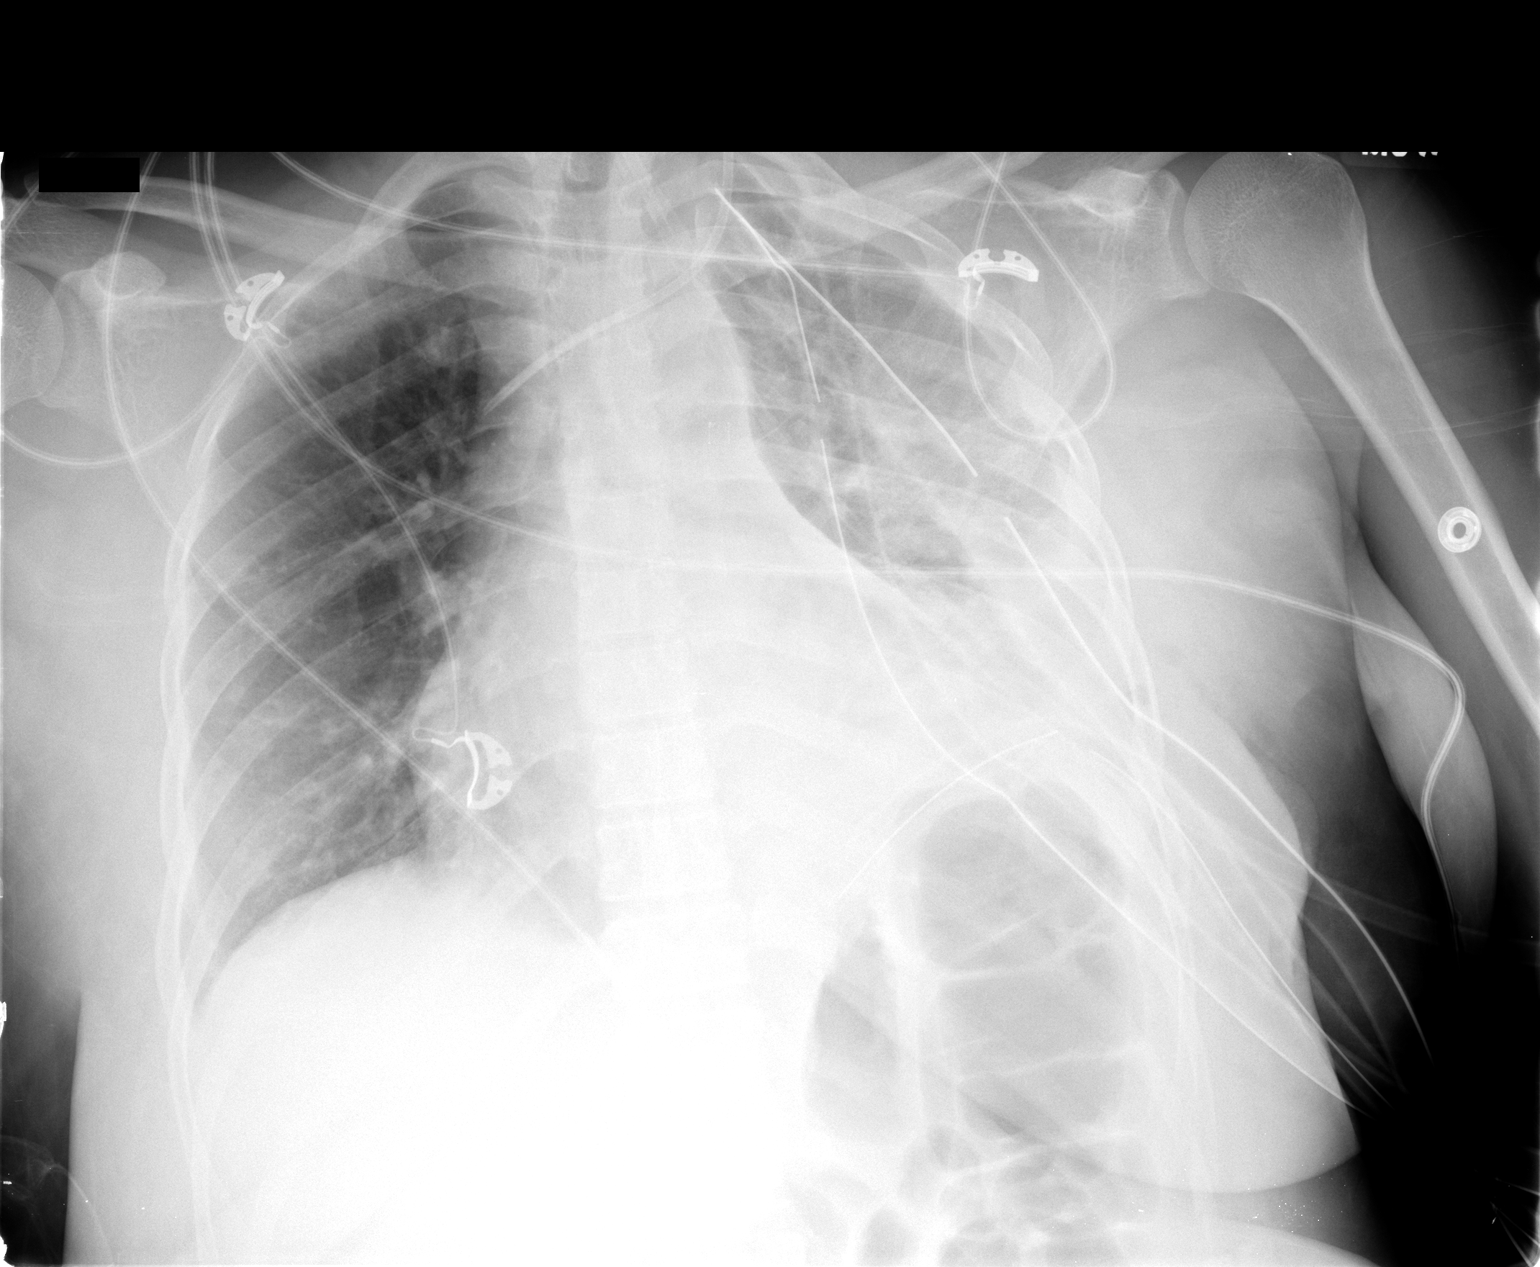

[1 of 1 positions shown; findings below may reference images not displayed]

FINDINGS: Three left chest tubes remain in place with no
pneumothorax or significant residual pleural fluid.  Airspace
density in the left lung persist with slight improvement.  Right
lung clear.  Central line unchanged.
IMPRESSION: Slight improvement of left lung aeration - little overall change.

## 2011-08-18 LAB — URINE CULTURE: Colony Count: 9000

## 2011-08-18 LAB — PREGNANCY, URINE: Preg Test, Ur: NEGATIVE

## 2011-08-18 LAB — URINALYSIS, ROUTINE W REFLEX MICROSCOPIC
Glucose, UA: NEGATIVE
Ketones, ur: NEGATIVE
Protein, ur: 30 — AB
Urobilinogen, UA: 0.2

## 2011-08-18 LAB — URINE MICROSCOPIC-ADD ON

## 2013-06-19 ENCOUNTER — Emergency Department (INDEPENDENT_AMBULATORY_CARE_PROVIDER_SITE_OTHER)
Admission: EM | Admit: 2013-06-19 | Discharge: 2013-06-19 | Disposition: A | Discharge status: Home / Self Care | Payer: Self-pay | Source: Home / Self Care | Attending: Emergency Medicine | Admitting: Emergency Medicine

## 2013-06-19 ENCOUNTER — Encounter (HOSPITAL_COMMUNITY): Payer: Self-pay | Admitting: Emergency Medicine

## 2013-06-19 DIAGNOSIS — K044 Acute apical periodontitis of pulpal origin: Secondary | ICD-10-CM

## 2013-06-19 DIAGNOSIS — K047 Periapical abscess without sinus: Secondary | ICD-10-CM

## 2013-06-19 HISTORY — DX: Other disorders of lung: J98.4

## 2013-06-19 MED ORDER — MELOXICAM 15 MG PO TABS: 15.0000 mg | ORAL_TABLET | Freq: Every day | ORAL | Status: DC

## 2013-06-19 MED ORDER — PENICILLIN V POTASSIUM 500 MG PO TABS: 500.0000 mg | ORAL_TABLET | Freq: Three times a day (TID) | ORAL | Status: DC

## 2013-06-19 MED ORDER — METRONIDAZOLE 500 MG PO TABS: 500.0000 mg | ORAL_TABLET | Freq: Three times a day (TID) | ORAL | Status: DC

## 2013-06-19 MED ORDER — OXYCODONE-ACETAMINOPHEN 5-325 MG PO TABS: ORAL_TABLET | ORAL | Status: DC

## 2013-06-19 NOTE — ED Notes (Signed)
Pt c/o facial abscess due to dental pain. Face swelling started yesterday. Dental pain began on Saturday. Pt reports she has been unable to eat or drink. Was previously seeing a dentist but was laid off from job and lost her dental insurance. Pt is alert and oriented.

## 2013-06-19 NOTE — ED Provider Notes (Signed)
Chief Complaint:   Chief Complaint  Patient presents with  . Dental Pain    History of Present Illness:   Brianna Padilla is a 37 year old female who has had a two-day history of a toothache involving the lower right second molar. Her gingiva and cheek seemed to be swollen. It hurts to chew on that side. She is able to open her mouth. She denies any swelling of the floor the mouth or the tongue. She has no trouble swallowing or breathing. She's felt a little bit feverish and had some headache. She denies any ear or neck pain. No chest pain or shortness of breath.  Review of Systems:  Other than noted above, the patient denies any of the following symptoms: Systemic:  No fever, chills,  Or sweats. ENT:  No headache, ear ache, sore throat, nasal congestion, facial pain, or swelling. Lymphatic:  No adenopathy. Lungs:  No coughing, wheezing or shortness of breath.  PMFSH:  Past medical history, family history, social history, meds, and allergies were reviewed. She had lung surgery 2-3 years ago for what sounds like a pneumothorax.  Physical Exam:   Vital signs:  BP 139/87  Pulse 59  Temp(Src) 98.6 F (37 C) (Oral)  Resp 16  SpO2 100%  LMP 06/19/2013 General:  Alert, oriented, in no distress. ENT:  TMs and canals normal.  Nasal mucosa normal. Mouth exam:  Her right, lower, second molar was decayed. There was no purulent drainage. No surrounding erythema. No visible swelling of the face or the neck. No swelling of the floor the mouth. The pharynx was clear and widely patent. Neck:  No swelling or adenopathy. Lungs:  Breath sounds clear and equal bilaterally.  No wheezes, rales or rhonchi. Heart:  Regular rhythm.  No gallops or murmers. Skin:  Clear, warm and dry.   Assessment:  The encounter diagnosis was Dental infection.  Plan:   1.  The following meds were prescribed:   Discharge Medication List as of 06/19/2013 11:32 AM    START taking these medications   Details  meloxicam  (MOBIC) 15 MG tablet Take 1 tablet (15 mg total) by mouth daily., Starting 06/19/2013, Until Discontinued, Normal    metroNIDAZOLE (FLAGYL) 500 MG tablet Take 1 tablet (500 mg total) by mouth 3 (three) times daily., Starting 06/19/2013, Until Discontinued, Normal    oxyCODONE-acetaminophen (PERCOCET) 5-325 MG per tablet 1 to 2 tablets every 6 hours as needed for pain., Print    penicillin v potassium (VEETID) 500 MG tablet Take 1 tablet (500 mg total) by mouth 3 (three) times daily., Starting 06/19/2013, Until Discontinued, Normal       2.  The patient was instructed in symptomatic care and handouts were given.  Suggested sleeping with head of bed elevated and hot salt water mouthwash. 3.  The patient was told to return if becoming worse in any way, if no better in 3 or 4 days, and given some red flag symptoms such as difficulty swallowing or breathing that would indicate earlier return, especially difficulty breathing. 4.  The patient was told to follow up with a dentist as soon as possible.    Reuben Likes, MD 06/19/13 [phone]

## 2013-12-29 ENCOUNTER — Encounter (HOSPITAL_COMMUNITY): Payer: Self-pay | Admitting: Emergency Medicine

## 2013-12-29 ENCOUNTER — Emergency Department (HOSPITAL_COMMUNITY)
Admission: EM | Admit: 2013-12-29 | Discharge: 2013-12-29 | Disposition: A | Discharge status: Home / Self Care | Payer: Self-pay | Attending: Emergency Medicine | Admitting: Emergency Medicine

## 2013-12-29 DIAGNOSIS — Z8709 Personal history of other diseases of the respiratory system: Secondary | ICD-10-CM | POA: Insufficient documentation

## 2013-12-29 DIAGNOSIS — Z91040 Latex allergy status: Secondary | ICD-10-CM | POA: Insufficient documentation

## 2013-12-29 DIAGNOSIS — R21 Rash and other nonspecific skin eruption: Secondary | ICD-10-CM | POA: Insufficient documentation

## 2013-12-29 MED ORDER — PERMETHRIN 5 % EX CREA: TOPICAL_CREAM | CUTANEOUS | Status: DC

## 2013-12-29 NOTE — ED Notes (Signed)
Patient states she started itching about a week or two ago.   Patient states she recently found out that she was exposed to scabies.   Patient has rash on tops of thighs, back and around trunk.   Patient states no rash on hands or feet, nothing on wrist or ankles.

## 2013-12-29 NOTE — Discharge Instructions (Signed)
Apply ointment as directed - see below Return to the emergency department if you develop any changing/worsening condition, fever, spreading redness/swelling, drainage, or any other concerns (please read additional information regarding your condition below)     Scabies Scabies are small bugs (mites) that burrow under the skin and cause red bumps and severe itching. These bugs can only be seen with a microscope. Scabies are highly contagious. They can spread easily from person to person by direct contact. They are also spread through sharing clothing or linens that have the scabies mites living in them. It is not unusual for an entire family to become infected through shared towels, clothing, or bedding.  HOME CARE INSTRUCTIONS   Your caregiver may prescribe a cream or lotion to kill the mites. If cream is prescribed, massage the cream into the entire body from the neck to the bottom of both feet. Also massage the cream into the scalp and face if your child is less than 73 year old. Avoid the eyes and mouth. Do not wash your hands after application.  Leave the cream on for 8 to 12 hours. Your child should bathe or shower after the 8 to 12 hour application period. Sometimes it is helpful to apply the cream to your child right before bedtime.  One treatment is usually effective and will eliminate approximately 95% of infestations. For severe cases, your caregiver may decide to repeat the treatment in 1 week. Everyone in your household should be treated with one application of the cream.  New rashes or burrows should not appear within 24 to 48 hours after successful treatment. However, the itching and rash may last for 2 to 4 weeks after successful treatment. Your caregiver may prescribe a medicine to help with the itching or to help the rash go away more quickly.  Scabies can live on clothing or linens for up to 3 days. All of your child's recently used clothing, towels, stuffed toys, and bed linens  should be washed in hot water and then dried in a dryer for at least 20 minutes on high heat. Items that cannot be washed should be enclosed in a plastic bag for at least 3 days.  To help relieve itching, bathe your child in a cool bath or apply cool washcloths to the affected areas.  Your child may return to school after treatment with the prescribed cream. SEEK MEDICAL CARE IF:   The itching persists longer than 4 weeks after treatment.  The rash spreads or becomes infected. Signs of infection include red blisters or yellow-tan crust. Document Released: 11/06/2005 Document Revised: 01/29/2012 Document Reviewed: 03/17/2009 [facility] Patient Information 2014 [place], [place].  Rash A rash is a change in the color or texture of your skin. There are many different types of rashes. You may have other problems that accompany your rash. CAUSES   Infections.  Allergic reactions. This can include allergies to pets or foods.  Certain medicines.  Exposure to certain chemicals, soaps, or cosmetics.  Heat.  Exposure to poisonous plants.  Tumors, both cancerous and noncancerous. SYMPTOMS   Redness.  Scaly skin.  Itchy skin.  Dry or cracked skin.  Bumps.  Blisters.  Pain. DIAGNOSIS  Your caregiver may do a physical exam to determine what type of rash you have. A skin sample (biopsy) may be taken and examined under a microscope. TREATMENT  Treatment depends on the type of rash you have. Your caregiver may prescribe certain medicines. For serious conditions, you may need to see a  skin doctor (dermatologist). HOME CARE INSTRUCTIONS   Avoid the substance that caused your rash.  Do not scratch your rash. This can cause infection.  You may take cool baths to help stop itching.  Only take over-the-counter or prescription medicines as directed by your caregiver.  Keep all follow-up appointments as directed by your caregiver. SEEK IMMEDIATE MEDICAL CARE IF:  You have increasing  pain, swelling, or redness.  You have a fever.  You have new or severe symptoms.  You have body aches, diarrhea, or vomiting.  Your rash is not better after 3 days. MAKE SURE YOU:  Understand these instructions.  Will watch your condition.  Will get help right away if you are not doing well or get worse. Document Released: 10/27/2002 Document Revised: 01/29/2012 Document Reviewed: 08/21/2011 [facility] Patient Information 2014 [place].    Emergency Department Resource Guide 1) Find a Doctor and Pay Out of Pocket Although you won't have to find out who is covered by your insurance plan, it is a good idea to ask around and get recommendations. You will then need to call the office and see if the doctor you have chosen will accept you as a new patient and what types of options they offer for patients who are self-pay. Some doctors offer discounts or will set up payment plans for their patients who do not have insurance, but you will need to ask so you aren't surprised when you get to your appointment.  2) Contact Your Local Health Department Not all health departments have doctors that can see patients for sick visits, but many do, so it is worth a call to see if yours does. If you don't know where your local health department is, you can check in your phone book. The CDC also has a tool to help you locate your state's health department, and many state websites also have listings of all of their local health departments.  3) Find a Walk-in Clinic If your illness is not likely to be very severe or complicated, you may want to try a walk in clinic. These are popping up all over the country in pharmacies, drugstores, and shopping centers. They're usually staffed by nurse practitioners or physician assistants that have been trained to treat common illnesses and complaints. They're usually fairly quick and inexpensive. However, if you have serious medical issues or chronic medical  problems, these are probably not your best option.  No Primary Care Doctor: - Call Health Connect at  [phone] - they can help you locate a primary care doctor that  accepts your insurance, provides certain services, etc. - Physician Referral Service- [phone]  Chronic Pain Problems: Organization         Address  Phone   Notes  [facility]  [phone] Patients need to be referred by their primary care doctor.   Medication Assistance: Organization         Address  Phone   Notes  [facility] Med[facility] [address] [place], [place] [phone] --Must be a resident of [facility] [place] -- Must have NO insurance coverage whatsoever (no Medicaid/ Medicare, etc.) -- The pt. MUST have a primary care doctor that directs their care regularly and follows them in the community   Med[facility]ist  [phone]   [employer]  [phone]    Agencies that provide inexpensive medical care: Retail buyerrganization         Address  Phone  Notes  [facility]  [phone]   [facility]    [phone]   [facility] [address] [place], [place] [address] [phone]   [facility] [address] [place] [place] [phone]   Planned Parenthood    [phone]   [facility]    [phone]   [facility]  [address], [place] Phone:  [phone], Fax:  [phone] Hours of Operation:  9 am - 6 pm, M-F.  Also accepts Medicaid/Medicare and self-pay.  [facility]  [address], [place] Phone: [phone], Fax: [phone]. Hours of Operation:  8:30 am - 5:30 pm, M-F.  Also accepts Medicaid and self-pay.  [facility] [place] [address] [place] Phone: [phone]   [facility] [address] Natasha Bence [place], [place] [phone], Ext. [phone]  Mondays & Thursdays: 7-9 AM.  First 15 patients are seen on a first come, first serve basis.    Medicaid-accepting [facility] Providers:  Organization         Address  Phone   Notes  [facility] [address], [place] [phone] Also accepts self-pay patients.  [facility] [address] Laurell Josephs [place], [place]  [phone]   [facility] [address], [place] [phone]   [facility] [address], [place] [phone]   Renaye Rakers [address], [place]   [phone] Only accepts [place] patients after they have their name applied to their card.   Self-Pay (no insurance) in [facility]:  Organization         Address  Phone   Notes  Sickle Cell Patients, [facility] [address] [place], [place] [phone]   [facility] [address] [place], [place] [phone]   [facility] [place]  [address], [place] [phone]   [facility]/Dr. Osei-Bonsu  [address] [place] or [address], [place] [phone] Phone number for both [place] and [place] locations is the same.  [facility] and [facility] [address], [place] [phone]   [facility] [address] [place] or [address] [phone]   [facility] [address], [place] [phone], phone; [phone], fax Sees patients 1st and 3rd Saturday of every month.  Must not qualify for public or private insurance (i.e. Medicaid, Medicare, [place] Health Choice, Veterans' Benefits)  Household income should be no more than 200% of the poverty level The clinic cannot treat you if you are pregnant or think you are pregnant  Sexually transmitted diseases are not treated at  the clinic.    Dental Care: Organization         Address  Phone  Notes  [facility] of [facility]  [facility] [address] [place], [place] [phone] Accepts children up to age 66 who are enrolled in [place] or [place] Health Choice; pregnant women with a Medicaid card; and children who have applied for Medicaid or  Health Choice, but were declined, whose parents can pay a reduced fee at time of service.  [facility]  Department of [facility]  [address], [place] [phone] Accepts children up to age 58 who are enrolled in [place] or [place] Health Choice; pregnant women with a Medicaid card; and children who have applied for Medicaid or [place] Health Choice, but were declined, whose parents can pay a reduced fee at time of service.  [facility]  [address] [place], [place] [phone] Patients are seen by appointment only. Walk-ins are not accepted. [facility] will see patients 67 years of age and older. Monday - Tuesday (8am-5pm) Most Wednesdays (8:30-5pm) $30 per visit, cash only  [facility]  [address], [facility] [phone] Patients are seen by appointment only. Walk-ins are not accepted. [facility] will see patients 57 years of age and older. One Wednesday Evening (Monthly: Volunteer Based).  $30 per visit, cash only  [employer]  [phone] for adults; Children under age 36, call Graduate Pediatric Dentistry at [phone]. Children aged 71-14, please call [phone] to request a pediatric application.  Dental services are provided in all areas of dental care including fillings, crowns and bridges, complete and partial dentures, implants, gum treatment, root canals, and extractions. Preventive care is also provided. Treatment is provided to both adults and children. Patients are selected via a lottery and there is often a  waiting list.   [facility] [address], [place]  [phone] [email or web address]   [facility] [address] [place], [place] [phone], Ext. [phone] Second and Fourth Thursday of each month, opens at 6:30 AM; Clinic ends at 9 AM.  Patients are seen on a first-come first-served basis, and a limited number are seen during each clinic.   [facility]  [address] Ether Griffins [place], [place] [phone]   Eligibility Requirements You must have lived in [place], [place], or [place] counties for at least the last three months.   You cannot be eligible for state or federal sponsored [place], including [employer], [place], or [employer].   You generally cannot be eligible for healthcare insurance through your employer.    How to apply: Eligibility screenings are held every Tuesday and Wednesday afternoon from 1:00 pm until 4:00 pm. You do not need an appointment for the interview!  [facility] [address], [place], [place] [phone]   [facility] Health Department  [phone]   [facility] Health Department  [phone]   [facility] Health Department  [phone]    Behavioral Health Resources in the Community: Intensive Outpatient Programs Organization         Address  Phone  Notes  [facility] [address], [place], [place] [phone]   [facility] Outpatient [address], [place], [place] [phone]   [facility] [address], [place], [place]  [phone]   [facility] [address],  [place], [place] [phone] or [phone]   Substance Abuse Resources Organization         Address  Phone  Notes  [facility]  [phone]   [facility]  [phone]   [facility] [place] [facility]  [phone]   Floydene Flock  [phone]   Residential & Outpatient Substance Abuse  Program  [phone]   Psychological Services Organization         Address  Phone  Notes  [facility]  [phone]   [facility] Services  [phone]   [facility] Health [address] [place] [phone] or [phone]    Mobile Crisis Teams Organization         Address  Phone  Notes  [facility], Mobile Crisis Care Unit  [phone]   [facility]  [address] [place], [place] [phone]   Doristine Locks [address] [place] [phone]    Self-Help/Support Groups Organization         Address  Phone             Notes  [facility] [place] - variety of support groups  336- I7437963 Call for more information  Narcotics Anonymous (NA), [facility] [address], [employer] [place]  2 meetings at this location   Statistician         Address  Phone  Notes  [facility] [address],    [place]  [phone]   [facility]  [address], [place] [address], [place], [place] [phone]   [facility] [address] [place], [place] [phone] Admissions: 8am-3pm M-F  [facility] [address]    [place], [place] [phone]   [facility] [address] [place], [place], [place] [phone]   [facility] [address]  [place], [place] [phone]   [facility] - Intensive Outpatient [address] Laurell Josephs [address], [place], [place] [phone]   [facility] ([facility] [address] [place].,  [place], [place] [phone] or [phone]   [facility] ([facility]) [address] [place], [place] [phone] Accepts Medicaid  [facility] [address],  [place] [phone] Substance Abuse/Addiction Treatment   [facility] Organization          Address  Phone  Notes  [facility]  [phone]   Angie Fava, PhD [address] Ervin Knack [place], [place]   [phone] or [phone]   [facility] Behavioral   [address] [place], [place] [phone]   [facility] [address], [place], [place] [phone] Insurance/Medicaid/sponsorship through [facility] [address]                                    [place], [place] [phone] Therapy/tele-psych/case  [facility] [address], [place] [phone]    Dr. Lolly Mustache  [phone]   [facility]  United [employer] [facility] Dept. 1) [address] [place] 2) [address], [place] 3)  [address], [place] [phone]  [phone]   [facility] Child Abuse Hotline 762-517-0795 or [phone] (After Hours)

## 2013-12-29 NOTE — ED Provider Notes (Signed)
CSN: [id number]     Arrival date & time 12/29/13  1354 History  This chart was scribed for non-physician practitioner, Brianna CeoJessica Paulett Kaufhold, PA-C working with Brianna Levanharles B. Bernette MayersSheldon, MD by Brianna StallionKayla Padilla, ED scribe. This patient was seen in room TR11C/TR11C and the patient's care was started at 3:33 PM.   Chief Complaint  Patient presents with  . Rash   The history is provided by the patient. No language interpreter was used.   HPI Comments: Brianna Padilla is a 38 y.o. female who presents to the Emergency Department complaining of a worsening, itchy rash that started 1-2 weeks ago. It is on her arms, legs, back, buttock and abdomen. She states she was exposed to scabies so she used some of her friend's cream for treatment 3 days ago with little relief. Denies fever, mouth sores. No new soaps/detergents. No similar rash in the past.     Past Medical History  Diagnosis Date  . Lung abnormality    Past Surgical History  Procedure Laterality Date  . Lung surgery     No family history on file. History  Substance Use Topics  . Smoking status: Never Smoker   . Smokeless tobacco: Not on file  . Alcohol Use: Yes     Comment: occasionally   OB History   Grav Para Term Preterm Abortions TAB SAB Ect Mult Living                 Review of Systems  Constitutional: Negative for fever, chills, diaphoresis, activity change and appetite change.  HENT: Negative for congestion, mouth sores, rhinorrhea and sore throat.   Respiratory: Negative for cough and shortness of breath.   Cardiovascular: Negative for chest pain.  Gastrointestinal: Negative for nausea, vomiting, abdominal pain and diarrhea.  Musculoskeletal: Negative for myalgias.  Skin: Positive for rash. Negative for color change.  All other systems reviewed and are negative.   Allergies  Latex  Home Medications  No current outpatient prescriptions on file.  BP 135/88  Pulse 76  Temp(Src) 98.5 F (36.9 C) (Oral)  Resp 18  Ht 5\' 1"   (1.549 m)  Wt 180 lb (81.647 kg)  BMI 34.03 kg/m2  SpO2 100%  LMP 12/01/2013  Filed Vitals:   12/29/13 1431  BP: 135/88  Pulse: 76  Temp: 98.5 F (36.9 C)  TempSrc: Oral  Resp: 18  Height: 5\' 1"  (1.549 m)  Weight: 180 lb (81.647 kg)  SpO2: 100%    Physical Exam  Nursing note and vitals reviewed. Constitutional: She is oriented to person, place, and time. She appears well-developed and well-nourished. No distress.  HENT:  Head: Normocephalic and atraumatic.  Right Ear: External ear normal.  Left Ear: External ear normal.  Nose: Nose normal.  Mouth/Throat: Uvula is midline, oropharynx is clear and moist and mucous membranes are normal. No oropharyngeal exudate.  No sores to the oral cavity throughout  Eyes: Conjunctivae and EOM are normal. Right eye exhibits no discharge. Left eye exhibits no discharge.  Neck: Normal range of motion. Neck supple. No tracheal deviation present.  Cardiovascular: Normal rate, regular rhythm and normal heart sounds.  Exam reveals no gallop and no friction rub.   No murmur heard. Pulmonary/Chest: Effort normal and breath sounds normal. No respiratory distress. She has no wheezes. She has no rales.  Abdominal: Soft. She exhibits no distension. There is no tenderness.  Musculoskeletal: Normal range of motion. She exhibits no edema and no tenderness.  Neurological: She is alert and oriented to person,  place, and time.  Skin: Skin is warm and dry. She is not diaphoretic.  Diffuse dark colored closed circular macular-papular lesions in various sizes (<5 mm) scattered throughout the abdomen, UE, LE and buttocks. No open wounds/drainage. No underlying erythema. Patching scratching throughout exam. No rash to palms of hands.   Psychiatric: She has a normal mood and affect. Her behavior is normal.    ED Course  Procedures (including critical care time)  DIAGNOSTIC STUDIES: Oxygen Saturation is 100% on RA, normal by my interpretation.    COORDINATION  OF CARE: 3:37 PM-Discussed treatment plan which includes scabies cream with pt at bedside and pt agreed to plan.   Labs Review Labs Reviewed - No data to display Imaging Review No results found.  EKG Interpretation   None       MDM   Brianna Padilla is a 38 y.o. female who presents to the Emergency Department complaining of a worsening, itchy rash that started 1-2 weeks ago. Rash possibly due to scabies with recent exposure. Patient afebrile and non-toxic in appearance. Patient given prescription for Elimite cream. Educated patient on good hygiene and to wash bedding. Patient encouraged to follow-up with a PCP. Return precautions, discharge instructions, and follow-up was discussed with the patient before discharge.      Discharge Medication List as of 12/29/2013  3:42 PM    START taking these medications   Details  permethrin (ELIMITE) 5 % cream Apply to affected area once, Print        Final impressions: 1. Rash      Brianna Iron PA-C   I personally performed the services described in this documentation, which was scribed in my presence. The recorded information has been reviewed and is accurate.       Brianna Ledger, PA-C 12/30/13 [phone]

## 2013-12-31 NOTE — ED Provider Notes (Signed)
Medical screening examination/treatment/procedure(s) were performed by non-physician practitioner and as supervising physician I was immediately available for consultation/collaboration.  EKG Interpretation   None         Tema Alire B. Bernette MayersSheldon, MD 12/31/13 [phone]

## 2014-03-20 ENCOUNTER — Emergency Department (HOSPITAL_COMMUNITY)
Admission: EM | Admit: 2014-03-20 | Discharge: 2014-03-20 | Disposition: A | Discharge status: Home / Self Care | Payer: Self-pay | Attending: Emergency Medicine | Admitting: Emergency Medicine

## 2014-03-20 ENCOUNTER — Encounter (HOSPITAL_COMMUNITY): Payer: Self-pay | Admitting: Emergency Medicine

## 2014-03-20 DIAGNOSIS — F17200 Nicotine dependence, unspecified, uncomplicated: Secondary | ICD-10-CM | POA: Insufficient documentation

## 2014-03-20 DIAGNOSIS — Z91040 Latex allergy status: Secondary | ICD-10-CM | POA: Insufficient documentation

## 2014-03-20 DIAGNOSIS — R21 Rash and other nonspecific skin eruption: Secondary | ICD-10-CM | POA: Insufficient documentation

## 2014-03-20 MED ORDER — CLOTRIMAZOLE 1 % EX SOLN: 1.0000 "application " | Freq: Two times a day (BID) | CUTANEOUS | Status: DC

## 2014-03-20 NOTE — ED Notes (Signed)
Pt reports she has noticed dark itchy spots to her legs and abdomen for past few days.

## 2014-03-20 NOTE — ED Provider Notes (Signed)
CSN: [id number]     Arrival date & time 03/20/14  1616 History  This chart was scribed for non-physician practitioner, Brianna EmeryNicole Jatziry Wechter, PA-C working with Geoffery Lyonsouglas Delo, MD by Greggory StallionKayla Andersen, ED scribe. This patient was seen in room TR08C/TR08C and the patient's care was started at 5:13 PM.   Chief Complaint  Patient presents with  . Rash   The history is provided by the patient. No language interpreter was used.   HPI Comments: Brianna Padilla is a 38 y.o. female who presents to the Emergency Department complaining of a dark, itchy rash that started about 2 days ago. Pt states it started on bilateral legs and has spread to her abdomen. She states she was recently treated for scabies. Pt has tried a moisturizing lotion with no relief. Denies any new soaps, detergents, medications, pets. Denies fever.   Past Medical History  Diagnosis Date  . Lung abnormality    Past Surgical History  Procedure Laterality Date  . Lung surgery     History reviewed. No pertinent family history. History  Substance Use Topics  . Smoking status: Current Every Day Smoker -- 1.00 packs/day  . Smokeless tobacco: Not on file  . Alcohol Use: Yes     Comment: occasionally   OB History   Grav Para Term Preterm Abortions TAB SAB Ect Mult Living                 Review of Systems  Constitutional: Negative for fever.  Skin: Positive for rash.  All other systems reviewed and are negative.  Allergies  Latex  Home Medications   Prior to Admission medications   Medication Sig Start Date End Date Taking? Authorizing Provider  permethrin (ELIMITE) 5 % cream Apply to affected area once 12/29/13   Jillyn LedgerJessica K Palmer, PA-C   BP 129/68  Pulse 88  Temp(Src) 98.5 F (36.9 C)  Resp 18  SpO2 100%  Physical Exam  Nursing note and vitals reviewed. Constitutional: She is oriented to person, place, and time. She appears well-developed and well-nourished. No distress.  HENT:  Head: Normocephalic.  Eyes: Conjunctivae  and EOM are normal.  Cardiovascular: Normal rate, regular rhythm and intact distal pulses.   Pulmonary/Chest: Effort normal and breath sounds normal. No stridor.  Abdominal: Soft. Bowel sounds are normal. She exhibits no distension and no mass. There is no tenderness. There is no rebound and no guarding.  Musculoskeletal: Normal range of motion.  Neurological: She is alert and oriented to person, place, and time.  Skin:  Hyperpigmented plaques ranging from 1-3 cm with no central clearing. No erythema, induration, tenderness to palpation or discharge. Slight scaling  Psychiatric: She has a normal mood and affect.    ED Course  Procedures (including critical care time)  DIAGNOSTIC STUDIES: Oxygen Saturation is 100% on RA, normal by my interpretation.    COORDINATION OF CARE: 5:17 PM-Discussed treatment plan which includes fungal infection medication with pt at bedside and pt agreed to plan. Will give pt dermatology referral and advised her to follow up if symptoms do no resolve.   Labs Review Labs Reviewed - No data to display  Imaging Review No results found.   EKG Interpretation None      MDM   Final diagnoses:  Rash    Filed Vitals:   03/20/14 1631  BP: 129/68  Pulse: 88  Temp: 98.5 F (36.9 C)  Resp: 18  SpO2: 100%    Medications - No data to display  Tonette LedererKeisha M  Arnold LongSloan is a 38 y.o. female presenting with pleuritic rash. There is a slight scale and although it is not atypical fungal rash I will treat her with Lotrimin. We discussed the possibility of a allergic reaction as well. I have advised her to please follow with dermatology.   Evaluation does not show pathology that would require ongoing emergent intervention or inpatient treatment. Pt is hemodynamically stable and mentating appropriately. Discussed findings and plan with patient/guardian, who agrees with care plan. All questions answered. Return precautions discussed and outpatient follow up given.    Discharge Medication List as of 03/20/2014  5:23 PM    START taking these medications   Details  clotrimazole (LOTRIMIN) 1 % external solution Apply 1 application topically 2 (two) times daily., Starting 03/20/2014, Until Discontinued, Print        Note: Portions of this report may have been transcribed using voice recognition software. Every effort was made to ensure accuracy; however, inadvertent computerized transcription errors may be present   I personally performed the services described in this documentation, which was scribed in my presence. The recorded information has been reviewed and is accurate.  Brianna Emeryicole Reata Petrov, PA-C 03/25/14 1601

## 2014-03-20 NOTE — Discharge Instructions (Signed)
Please follow with your primary care doctor in the next 2 days for a check-up. They must obtain records for further management.  ° °Do not hesitate to return to the Emergency Department for any new, worsening or concerning symptoms.  ° °

## 2014-03-28 NOTE — ED Provider Notes (Signed)
Medical screening examination/treatment/procedure(s) were performed by non-physician practitioner and as supervising physician I was immediately available for consultation/collaboration.     Stanley Helmuth, MD 03/28/14 1927 

## 2015-11-18 ENCOUNTER — Encounter (HOSPITAL_COMMUNITY): Payer: Self-pay

## 2015-11-18 ENCOUNTER — Emergency Department (HOSPITAL_COMMUNITY)
Admission: EM | Admit: 2015-11-18 | Discharge: 2015-11-18 | Disposition: A | Discharge status: Home / Self Care | Payer: 59 | Attending: Physician Assistant | Admitting: Physician Assistant

## 2015-11-18 DIAGNOSIS — A64 Unspecified sexually transmitted disease: Secondary | ICD-10-CM

## 2015-11-18 DIAGNOSIS — F17200 Nicotine dependence, unspecified, uncomplicated: Secondary | ICD-10-CM | POA: Insufficient documentation

## 2015-11-18 DIAGNOSIS — Z91040 Latex allergy status: Secondary | ICD-10-CM | POA: Insufficient documentation

## 2015-11-18 LAB — URINALYSIS, ROUTINE W REFLEX MICROSCOPIC
Bilirubin Urine: NEGATIVE
Glucose, UA: NEGATIVE mg/dL
Hgb urine dipstick: NEGATIVE
KETONES UR: NEGATIVE mg/dL
LEUKOCYTES UA: NEGATIVE
NITRITE: NEGATIVE
PH: 6.5 (ref 5.0–8.0)
Protein, ur: NEGATIVE mg/dL
SPECIFIC GRAVITY, URINE: 1.021 (ref 1.005–1.030)

## 2015-11-18 MED ORDER — LIDOCAINE HCL (PF) 1 % IJ SOLN
2.0000 mL | Freq: Once | INTRAMUSCULAR | Status: AC
  Administered 2015-11-18: 2 mL
  Filled 2015-11-18: qty 5

## 2015-11-18 MED ORDER — AZITHROMYCIN 250 MG PO TABS
1000.0000 mg | ORAL_TABLET | Freq: Once | ORAL | Status: AC
  Administered 2015-11-18: 1000 mg via ORAL
  Filled 2015-11-18: qty 4

## 2015-11-18 MED ORDER — CEFTRIAXONE SODIUM 250 MG IJ SOLR
250.0000 mg | Freq: Once | INTRAMUSCULAR | Status: AC
  Administered 2015-11-18: 250 mg via INTRAMUSCULAR
  Filled 2015-11-18: qty 250

## 2015-11-18 NOTE — Discharge Instructions (Signed)
Brianna Padilla,  Nice meeting you! Please follow-up with your primary care provider. Return to the emergency department if you develop fevers, chills, have increasing abdominal pain.  Ortencia Kick, PA-C   Sexually Transmitted Disease A sexually transmitted disease (STD) is a disease or infection that may be passed (transmitted) from person to person, usually during sexual activity. This may happen by way of saliva, semen, blood, vaginal mucus, or urine. Common STDs include:  Gonorrhea.  Chlamydia.  Syphilis.  HIV and AIDS.  Genital herpes.  Hepatitis B and C.  Trichomonas.  Human papillomavirus (HPV).  Pubic lice.  Scabies.  Mites.  Bacterial vaginosis. WHAT ARE CAUSES OF STDs? An STD may be caused by bacteria, a virus, or parasites. STDs are often transmitted during sexual activity if one person is infected. However, they may also be transmitted through nonsexual means. STDs may be transmitted after:   Sexual intercourse with an infected person.  Sharing sex toys with an infected person.  Sharing needles with an infected person or using unclean piercing or tattoo needles.  Having intimate contact with the genitals, mouth, or rectal areas of an infected person.  Exposure to infected fluids during birth. WHAT ARE THE SIGNS AND SYMPTOMS OF STDs? Different STDs have different symptoms. Some people may not have any symptoms. If symptoms are present, they may include:  Painful or bloody urination.  Pain in the pelvis, abdomen, vagina, anus, throat, or eyes.  A skin rash, itching, or irritation.  Growths, ulcerations, blisters, or sores in the genital and anal areas.  Abnormal vaginal discharge with or without bad odor.  Penile discharge in men.  Fever.  Pain or bleeding during sexual intercourse.  Swollen glands in the groin area.  Yellow skin and eyes (jaundice). This is seen with hepatitis.  Swollen testicles.  Infertility.  Sores and  blisters in the mouth. HOW ARE STDs DIAGNOSED? To make a diagnosis, your health care provider may:  Take a medical history.  Perform a physical exam.  Take a sample of any discharge to examine.  Swab the throat, cervix, opening to the penis, rectum, or vagina for testing.  Test a sample of your first morning urine.  Perform blood tests.  Perform a Pap test, if this applies.  Perform a colposcopy.  Perform a laparoscopy. HOW ARE STDs TREATED? Treatment depends on the STD. Some STDs may be treated but not cured.  Chlamydia, gonorrhea, trichomonas, and syphilis can be cured with antibiotic medicine.  Genital herpes, hepatitis, and HIV can be treated, but not cured, with prescribed medicines. The medicines lessen symptoms.  Genital warts from HPV can be treated with medicine or by freezing, burning (electrocautery), or surgery. Warts may come back.  HPV cannot be cured with medicine or surgery. However, abnormal areas may be removed from the cervix, vagina, or vulva.  If your diagnosis is confirmed, your recent sexual partners need treatment. This is true even if they are symptom-free or have a negative culture or evaluation. They should not have sex until their health care providers say it is okay.  Your health care provider may test you for infection again 3 months after treatment. HOW CAN I REDUCE MY RISK OF GETTING AN STD? Take these steps to reduce your risk of getting an STD:  Use latex condoms, dental dams, and water-soluble lubricants during sexual activity. Do not use petroleum jelly or oils.  Avoid having multiple sex partners.  Do not have sex with someone who has other sex  partners  Do not have sex with anyone you do not know or who is at high risk for an STD.  Avoid risky sex practices that can break your skin.  Do not have sex if you have open sores on your mouth or skin.  Avoid drinking too much alcohol or taking illegal drugs. Alcohol and drugs can affect  your judgment and put you in a vulnerable position.  Avoid engaging in oral and anal sex acts.  Get vaccinated for HPV and hepatitis. If you have not received these vaccines in the past, talk to your health care provider about whether one or both might be right for you.  If you are at risk of being infected with HIV, it is recommended that you take a prescription medicine daily to prevent HIV infection. This is called pre-exposure prophylaxis (PrEP). You are considered at risk if:  You are a man who has sex with other men (MSM).  You are a heterosexual man or woman and are sexually active with more than one partner.  You take drugs by injection.  You are sexually active with a partner who has HIV.  Talk with your health care provider about whether you are at high risk of being infected with HIV. If you choose to begin PrEP, you should first be tested for HIV. You should then be tested every 3 months for as long as you are taking PrEP. WHAT SHOULD I DO IF I THINK I HAVE AN STD?  See your health care provider.  Tell your sexual partner(s). They should be tested and treated for any STDs.  Do not have sex until your health care provider says it is okay. WHEN SHOULD I GET IMMEDIATE MEDICAL CARE? Contact your health care provider right away if:   You have severe abdominal pain.  You are a man and notice swelling or pain in your testicles.  You are a woman and notice swelling or pain in your vagina.   This information is not intended to replace advice given to you by your health care provider. Make sure you discuss any questions you have with your health care provider.   Document Released: 01/27/2003 Document Revised: 11/27/2014 Document Reviewed: 05/27/2013 Elsevier Interactive Patient Education [employer]2016 Elsevier Inc.

## 2015-11-18 NOTE — ED Provider Notes (Signed)
CSN: [id number]     Arrival date & time 11/18/15  2136 History  By signing my name below, I, Tanda RockersMargaux Venter, attest that this documentation has been prepared under the direction and in the presence of Lane HackerNicole Lurine Imel, PA-C. Electronically Signed: Tanda RockersMargaux Venter, ED Scribe. 11/18/2015. 10:54 PM.   Chief Complaint  Patient presents with  . STD check    The history is provided by the patient. No language interpreter was used.   HPI Comments: Brianna Padilla is a 39 y.o. female who presents to the Emergency Department for STD check. Pt reports vaginal burning only after intercourse for the past week. Her significant other who she is monogamous with began having dysuria about a week ago. He went to the health department and was treated for an STD which relieved his symptoms. Pt and her significant other had protected intercourse about 3 days ago and her significant other began having dysuria again, prompting pt to come to the ED to be evaluated. Denies vaginal odor, vaginal discharge, dysuria, fever, chills, abdominal pain, or any other associated symptoms.   Past Medical History  Diagnosis Date  . Lung abnormality    Past Surgical History  Procedure Laterality Date  . Lung surgery     No family history on file. Social History  Substance Use Topics  . Smoking status: Current Every Day Smoker -- 1.00 packs/day  . Smokeless tobacco: None  . Alcohol Use: Yes     Comment: occasionally   OB History    No data available     Review of Systems  Constitutional: Negative for fever and chills.  Gastrointestinal: Negative for abdominal pain.  Genitourinary: Negative for dysuria, vaginal bleeding and vaginal discharge.  All other systems reviewed and are negative.  Allergies  Latex  Home Medications   Prior to Admission medications   Not on File   Triage Vitals: BP 156/94 mmHg  Pulse 65  Temp(Src) 98.3 F (36.8 C) (Oral)  Resp 18  Ht 5\' 2"  (1.575 m)  Wt 165 lb (74.844 kg)  BMI 30.17  kg/m2  SpO2 99%  LMP 11/07/2015   Physical Exam  Constitutional: She is oriented to person, place, and time. She appears well-developed and well-nourished. No distress.  HENT:  Head: Normocephalic and atraumatic.  Eyes: Conjunctivae and EOM are normal. Pupils are equal, round, and reactive to light. Right eye exhibits no discharge. Left eye exhibits no discharge. No scleral icterus.  Neck: Neck supple. No tracheal deviation present.  Cardiovascular: Normal rate, regular rhythm, normal heart sounds and intact distal pulses.  Exam reveals no gallop and no friction rub.   No murmur heard. Pulmonary/Chest: Effort normal and breath sounds normal. No respiratory distress. She has no wheezes. She has no rales. She exhibits no tenderness.  Abdominal: Soft. Bowel sounds are normal. She exhibits no distension and no mass. There is no tenderness. There is no rebound and no guarding.  Musculoskeletal: Normal range of motion. She exhibits no edema.  Lymphadenopathy:    She has no cervical adenopathy.  Neurological: She is alert and oriented to person, place, and time. Coordination normal.  Skin: Skin is warm and dry. No rash noted. She is not diaphoretic. No erythema.  Psychiatric: She has a normal mood and affect. Her behavior is normal.  Nursing note and vitals reviewed.   ED Course  Procedures  DIAGNOSTIC STUDIES: Oxygen Saturation is 99% on RA, normal by my interpretation.    COORDINATION OF CARE: 10:53 PM-Discussed treatment plan which includes  UA with pt at bedside and pt agreed to plan.   Labs Review Labs Reviewed  URINALYSIS, ROUTINE W REFLEX MICROSCOPIC (NOT AT [facility])  GC/CHLAMYDIA PROBE AMP ([place]) NOT AT [facility]    MDM   Final diagnoses:  Sexually transmitted disease   Patient non-toxic appearing and VSS. Patient does not want a pelvic exam, and is only wanting to be tested for partner reassurance. She is requesting urine gc/chlamydia test. UA unremarkable. Explained  results for gc/chlamdyia will not result for a couple of days. Treated for gonorrhea and chlamydia in ED. Patient may be safely discharged home. Discussed reasons for return. Patient to follow-up with primary care provider within one week. Patient in understanding and agreement with the plan.  I personally performed the services described in this documentation, which was scribed in my presence. The recorded information has been reviewed and is accurate.   Melton Krebs, PA-C 11/28/15 1636  Courteney Randall An, MD 12/01/15 1504

## 2015-11-18 NOTE — ED Notes (Signed)
Pt here with BF for vaginal burning right after intercourse and then it goes away after taking a shower. Denies any odor, discharge. BF here to get checked and he asked her to come get checked also.

## 2015-11-20 LAB — GC/CHLAMYDIA PROBE AMP (~~LOC~~) NOT AT ARMC
CHLAMYDIA, DNA PROBE: POSITIVE — AB
Neisseria Gonorrhea: NEGATIVE

## 2015-11-22 ENCOUNTER — Telehealth (HOSPITAL_COMMUNITY): Payer: Self-pay

## 2015-11-22 NOTE — Telephone Encounter (Deleted)
Positive for ****. Treated per protocol. [employer] form faxed. Attempting to contact. Unable to reach by telephone. Letter sent to address on record.

## 2015-11-22 NOTE — Telephone Encounter (Signed)
Spoke with pt. Verified ID. Informed of labs. Treated per protocol. [employer]HS form faxed. Pt informed to abstain from sexual activity x 10 days and to notify partner for testing and treatment.  

## 2015-11-25 ENCOUNTER — Inpatient Hospital Stay (HOSPITAL_COMMUNITY)
Admission: AD | Admit: 2015-11-25 | Discharge: 2015-11-25 | Disposition: A | Discharge status: Home / Self Care | Payer: 59 | Source: Ambulatory Visit | Attending: Family Medicine | Admitting: Family Medicine

## 2015-11-25 DIAGNOSIS — F17200 Nicotine dependence, unspecified, uncomplicated: Secondary | ICD-10-CM | POA: Insufficient documentation

## 2015-11-25 DIAGNOSIS — A749 Chlamydial infection, unspecified: Secondary | ICD-10-CM | POA: Insufficient documentation

## 2015-11-25 DIAGNOSIS — Z2089 Contact with and (suspected) exposure to other communicable diseases: Secondary | ICD-10-CM | POA: Diagnosis present

## 2015-11-25 MED ORDER — AZITHROMYCIN 500 MG PO TABS: 1000.0000 mg | ORAL_TABLET | Freq: Once | ORAL | Status: AC

## 2015-11-25 NOTE — MAU Note (Signed)
Brianna Padilla,CNM talked with pt and sent RX for zithromax.

## 2015-11-25 NOTE — Discharge Instructions (Signed)
Chlamydia, Female Chlamydia is an infection. It is spread through sexual contact. Chlamydia can be in different areas of the body. These areas include the cervix, urethra, throat, or rectum. You may not know you have chlamydia because many people never develop the symptoms. Chlamydia is not difficult to treat once you know you have it. However, if it is left untreated, chlamydia can lead to more serious health problems.  CAUSES  Chlamydia is caused by bacteria. It is a sexually transmitted disease. It is passed from an infected partner during intimate contact. This contact could be with the genitals, mouth, or rectal area. Chlamydia can also be passed from mothers to babies during birth. SIGNS AND SYMPTOMS  There may not be any symptoms. This is often the case early in the infection. If symptoms develop, they may include:  Mild pain and discomfort when urinating.  Redness, soreness, and swelling (inflammation) of the rectum.  Vaginal discharge.  Painful intercourse.  Abdominal pain.  Bleeding between menstrual periods. DIAGNOSIS  To diagnose this infection, your health care provider will do a pelvic exam. Cultures will be taken of the vagina, cervix, urine, and possibly the rectum to verify the diagnosis.  TREATMENT You will be given antibiotic medicines. If you are pregnant, certain types of antibiotics will need to be avoided. Any sexual partners should also be treated, even if they do not show symptoms. Your health care provider may test you for infection again 3 months after treatment. HOME CARE INSTRUCTIONS   Take your antibiotic medicine as directed by your health care provider. Finish the antibiotic even if you start to feel better.  Take medicines only as directed by your health care provider.  Inform any sexual partners about the infection. They should also be treated.  Do not have sexual contact until your health care provider tells you it is okay.  Get plenty of  rest.  Eat a well-balanced diet.  Drink enough fluids to keep your urine clear or pale yellow.  Keep all follow-up visits as directed by your health care provider. SEEK MEDICAL CARE IF:  You have painful urination.  You have abdominal pain.  You have vaginal discharge.  You have painful sexual intercourse.  You have bleeding between periods and after sex.  You have a fever. SEEK IMMEDIATE MEDICAL CARE IF:   You experience nausea or vomiting.  You experience excessive sweating (diaphoresis).  You have difficulty swallowing.   This information is not intended to replace advice given to you by your health care provider. Make sure you discuss any questions you have with your health care provider.   Document Released: 08/16/2005 Document Revised: 07/28/2015 Document Reviewed: 07/14/2013 Elsevier Interactive Patient Education 2016 Elsevier Inc.  

## 2015-11-25 NOTE — MAU Note (Signed)
Pt had been treated for chlamydia. Was rexposed before 10 days.want s to be retreated. Still having itching.

## 2015-11-25 NOTE — MAU Provider Note (Signed)
Chief Complaint:  Exposure to STD   Provider initiated contact at 1745 hrs   HPI  Brianna Padilla is a 40 y.o. No obstetric history on file.who presents to maternity admissions reporting Reinfection with chlamydia.   Was treated for Chlamydia in ED on 12/29 but went home and had sex the next day, so is here for another treatment. She reports no vaginal bleeding, urinary symptoms, h/a, dizziness, n/v, or fever/chills.     Past Medical History: Past Medical History  Diagnosis Date  . Lung abnormality     Past obstetric history: OB History  No data available    Past Surgical History: Past Surgical History  Procedure Laterality Date  . Lung surgery      Family History: No family history on file.  Social History: Social History  Substance Use Topics  . Smoking status: Current Every Day Smoker -- 1.00 packs/day  . Smokeless tobacco: Not on file  . Alcohol Use: Yes     Comment: occasionally    Allergies:  Allergies  Allergen Reactions  . Latex     condoms    Meds:  No prescriptions prior to admission    I have reviewed patient's Past Medical Hx, Surgical Hx, Family Hx, Social Hx, medications and allergies.   ROS:  Review of Systems  Gastrointestinal: Negative for nausea, vomiting, abdominal pain, diarrhea and constipation.  Genitourinary: Positive for vaginal discharge. Negative for dysuria, vaginal bleeding and pelvic pain.    Physical Exam  No data found.  Constitutional: Well-developed, well-nourished female in no acute distress.  Cardiovascular: normal rate  Respiratory: normal effort, no distress. GI:   MS: Extremeties normal ROM Neurologic: Alert and oriented x 4.   Grossly nonfocal. GU:  Skin:  Warm and Dry Psych:  Affect appropriate.    Labs: No results found for this or any previous visit (from the past 24 hour(s)).    Imaging:  No results found.  MAU Course/MDM: Discussed reasons for waiting to have sex after partners treated.  States  he was treated already. Pt stable at time of discharge.  Assessment: 1. Chlamydia infection     Plan: Discharge home Recommend no sex x 10 days Rx sent for Zithromax for Chlamydia   Follow-up Information    [facility]   Contact information:   [address] [place] [id number] [phone]        Medication List    TAKE these medications        azithromycin 500 MG tablet  Commonly known as:  ZITHROMAX  Take 2 tablets (1,000 mg total) by mouth once.       Encouraged to return here or to other Urgent Care/ED if she develops worsening of symptoms, increase in pain, fever, or other concerning symptoms.   Wynelle BourgeoisMarie Ilisa Hayworth CNM, MSN Certified Nurse-Midwife 11/25/2015 6:01 PM

## 2015-11-25 NOTE — MAU Note (Signed)
Urine in lab
